# Patient Record
Sex: Female | Born: 1985 | Race: White | Hispanic: No | Marital: Married | Smoking: Never smoker
Health system: Southern US, Community
[De-identification: ages and names within clinical notes are randomized; demographics above are authoritative.]

## PROBLEM LIST (undated history)

## (undated) ENCOUNTER — Inpatient Hospital Stay (HOSPITAL_COMMUNITY): Payer: Self-pay

## (undated) DIAGNOSIS — J45909 Unspecified asthma, uncomplicated: Secondary | ICD-10-CM

## (undated) DIAGNOSIS — D649 Anemia, unspecified: Secondary | ICD-10-CM

## (undated) DIAGNOSIS — N814 Uterovaginal prolapse, unspecified: Secondary | ICD-10-CM

## (undated) DIAGNOSIS — K219 Gastro-esophageal reflux disease without esophagitis: Secondary | ICD-10-CM

## (undated) DIAGNOSIS — I319 Disease of pericardium, unspecified: Secondary | ICD-10-CM

## (undated) DIAGNOSIS — O2686 Pruritic urticarial papules and plaques of pregnancy (PUPPP): Secondary | ICD-10-CM

## (undated) HISTORY — PX: WISDOM TOOTH EXTRACTION: SHX21

## (undated) HISTORY — DX: Uterovaginal prolapse, unspecified: N81.4

## (undated) HISTORY — PX: OTHER SURGICAL HISTORY: SHX169

---
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
## 2003-08-01 ENCOUNTER — Other Ambulatory Visit: Admission: RE | Admit: 2003-08-01 | Discharge: 2003-08-01 | Payer: Self-pay | Admitting: Family Medicine

## 2004-07-26 ENCOUNTER — Other Ambulatory Visit: Admission: RE | Admit: 2004-07-26 | Discharge: 2004-07-26 | Payer: Self-pay | Admitting: Family Medicine

## 2010-01-14 ENCOUNTER — Inpatient Hospital Stay (HOSPITAL_COMMUNITY): Admission: RE | Admit: 2010-01-14 | Discharge: 2010-01-16 | Payer: Self-pay | Admitting: Obstetrics & Gynecology

## 2010-11-08 LAB — CBC
HCT: 32.9 % — ABNORMAL LOW (ref 36.0–46.0)
HCT: 33.3 % — ABNORMAL LOW (ref 36.0–46.0)
Hemoglobin: 11.5 g/dL — ABNORMAL LOW (ref 12.0–15.0)
Hemoglobin: 11.9 g/dL — ABNORMAL LOW (ref 12.0–15.0)
MCHC: 34.9 g/dL (ref 30.0–36.0)
MCHC: 35.6 g/dL (ref 30.0–36.0)
Platelets: 177 10*3/uL (ref 150–400)
Platelets: 189 10*3/uL (ref 150–400)
RDW: 13.1 % (ref 11.5–15.5)
RDW: 13.3 % (ref 11.5–15.5)

## 2010-11-08 LAB — RPR TITER: RPR Titer: 1:2 {titer} — AB

## 2011-01-07 NOTE — Op Note (Signed)
[place]. [facility]  Patient:    Savannah Arnold, CHAMBLIN Visit Number: [id number] MRN: [id number]          Service Type: DSU Location: [facility] Attending Physician:  Consuello Bossier Dictated by:   Consuello Bossier., M.D. Proc. Date: 10/02/01 Admit Date:  10/02/2001                             Operative Report  PREOPERATIVE DIAGNOSIS:  Two pigmented lesions, one measuring 1 cm, and one measuring 1.5 cm.  Adjacent to one another in the left anterior scalp area.  POSTOPERATIVE DIAGNOSIS:  Two pigmented lesions, one measuring 1 cm, and one measuring 1.5 cm.  Adjacent to one another in the left anterior scalp area.  OPERATION:  Wide local excision of the two in continuity, producing a 3.5 cm in length complex wound closure.  SURGEON:  Consuello Bossier., M.D.  ANESTHESIA:  Xylocaine 2% with epinephrine 1:100,000.  FINDINGS:  The patient had two pigmented lesions, one larger than the other, in the left anterior scalp area for which the above surgical procedure was carried out.  DESCRIPTION OF PROCEDURE:  The patient was brought to the operating room, and marked off with a planned elliptical excisional biopsy of the two areas which lent themselves to be removed as one transverse specimen.  She was prepped with Betadine and draped sterilely.  She was anesthetized with Xylocaine 2% with epinephrine 1:100,000.  Following the onset of anesthesia, the elliptical excision of the two lesions in the one specimen was able to be performed. This left a resulting 3.5 cm in length open wound of the scalp, which was able to be closed with interrupted running #5-0 Prolene.  Neosporin ointment and light compression dressing was applied.  The patient tolerated the procedure well, and will return in approximately one week for suture removal, or before with any problems. Dictated by:   Consuello Bossier., M.D. Attending Physician:  Consuello Bossier DD:   10/02/01 TD:  10/02/01 Job: [id number]

## 2013-03-15 LAB — OB RESULTS CONSOLE ABO/RH: RH Type: POSITIVE

## 2013-03-15 LAB — OB RESULTS CONSOLE ANTIBODY SCREEN: ANTIBODY SCREEN: NEGATIVE

## 2013-03-15 LAB — OB RESULTS CONSOLE HIV ANTIBODY (ROUTINE TESTING): HIV: NONREACTIVE

## 2013-03-15 LAB — OB RESULTS CONSOLE RUBELLA ANTIBODY, IGM: Rubella: IMMUNE

## 2013-03-15 LAB — OB RESULTS CONSOLE HEPATITIS B SURFACE ANTIGEN: Hepatitis B Surface Ag: NEGATIVE

## 2013-08-18 ENCOUNTER — Inpatient Hospital Stay (HOSPITAL_COMMUNITY): Payer: BC Managed Care – PPO

## 2013-08-18 ENCOUNTER — Encounter (HOSPITAL_COMMUNITY): Payer: BC Managed Care – PPO

## 2013-08-18 ENCOUNTER — Encounter (HOSPITAL_COMMUNITY): Payer: Self-pay

## 2013-08-18 ENCOUNTER — Inpatient Hospital Stay (HOSPITAL_COMMUNITY)
Admission: AD | Admit: 2013-08-18 | Discharge: 2013-08-18 | Disposition: A | Discharge status: Home / Self Care | Payer: BC Managed Care – PPO | Source: Ambulatory Visit | Attending: Obstetrics & Gynecology | Admitting: Obstetrics & Gynecology

## 2013-08-18 DIAGNOSIS — M545 Low back pain, unspecified: Secondary | ICD-10-CM | POA: Insufficient documentation

## 2013-08-18 DIAGNOSIS — N859 Noninflammatory disorder of uterus, unspecified: Secondary | ICD-10-CM

## 2013-08-18 DIAGNOSIS — O30009 Twin pregnancy, unspecified number of placenta and unspecified number of amniotic sacs, unspecified trimester: Secondary | ICD-10-CM | POA: Insufficient documentation

## 2013-08-18 DIAGNOSIS — O4700 False labor before 37 completed weeks of gestation, unspecified trimester: Secondary | ICD-10-CM | POA: Insufficient documentation

## 2013-08-18 DIAGNOSIS — R109 Unspecified abdominal pain: Secondary | ICD-10-CM | POA: Insufficient documentation

## 2013-08-18 HISTORY — DX: Unspecified asthma, uncomplicated: J45.909

## 2013-08-18 LAB — WET PREP, GENITAL
Clue Cells Wet Prep HPF POC: NONE SEEN
Trich, Wet Prep: NONE SEEN
Yeast Wet Prep HPF POC: NONE SEEN

## 2013-08-18 LAB — URINALYSIS, ROUTINE W REFLEX MICROSCOPIC
Bilirubin Urine: NEGATIVE
Ketones, ur: NEGATIVE mg/dL
Nitrite: NEGATIVE
Protein, ur: NEGATIVE mg/dL
Urobilinogen, UA: 0.2 mg/dL (ref 0.0–1.0)

## 2013-08-18 NOTE — MAU Note (Signed)
Pt presents complaining of lower abdominal pain and states "she doesn't feel right and something has changed." States her discharge has changed to more of a watery consistency. Denies vaginal bleeding or discharge.

## 2013-08-18 NOTE — MAU Provider Note (Signed)
History     CSN: [id number]  Arrival date and time: 08/18/13 1520 Provider on unit: 1520 Provider notified: 1608 Provider at bedside: 1618     Chief Complaint  Patient presents with  . Abdominal Pain   HPI  Ms. Savannah Arnold is a 27 yo G2P1001 at 32.1 wks twin gestation by ultrasound.  She presents today with complaints  of abdominal cramping, low back pain and watery mucous discharge.  She reports (+) FM x 2.  She denies VB.   Past Medical History  Diagnosis Date  . Asthma     History reviewed. No pertinent past surgical history.  History reviewed. No pertinent family history.  History  Substance Use Topics  . Smoking status: Never Smoker   . Smokeless tobacco: Not on file  . Alcohol Use: No    Allergies:  Allergies  Allergen Reactions  . Sulfa Antibiotics Itching and Rash  . Tape Rash    Prescriptions prior to admission  Medication Sig Dispense Refill  . acetaminophen (TYLENOL) 500 MG tablet Take 1,000 mg by mouth every 6 (six) hours as needed for mild pain.      Marland Kitchen albuterol (PROVENTIL HFA;VENTOLIN HFA) 108 (90 BASE) MCG/ACT inhaler Inhale 2 puffs into the lungs every 6 (six) hours as needed (asthma).      Marland Kitchen amoxicillin (AMOXIL) 500 MG capsule Take 500 mg by mouth 3 (three) times daily.      . calcium carbonate (TUMS - DOSED IN MG ELEMENTAL CALCIUM) 500 MG chewable tablet Chew 1 tablet by mouth daily as needed for indigestion or heartburn.      Burnis Medin w/o A-FE-Methfol-FA-DHA (PRENA1 PLUS/QUATREFOLIC) 30-0.6-0.4 &300 MG MISC Take 30 mg by mouth at bedtime.         Review of Systems  Constitutional: Negative.   HENT: Negative.   Eyes: Negative.   Respiratory: Negative.   Cardiovascular: Negative.   Gastrointestinal: Positive for abdominal pain.       Lower abd cramping  Genitourinary: Negative.   Musculoskeletal: Negative.   Skin: Negative.   Neurological: Negative.   Endo/Heme/Allergies: Negative.   Psychiatric/Behavioral: Negative.    Results for  orders placed during the hospital encounter of 08/18/13 (from the past 24 hour(s))  URINALYSIS, ROUTINE W REFLEX MICROSCOPIC     Status: Abnormal   Collection Time    08/18/13  3:31 PM      Result Value Range   Color, Urine YELLOW  YELLOW   APPearance CLEAR  CLEAR   Specific Gravity, Urine <1.005 (*) 1.005 - 1.030   pH 6.0  5.0 - 8.0   Glucose, UA NEGATIVE  NEGATIVE mg/dL   Hgb urine dipstick TRACE (*) NEGATIVE   Bilirubin Urine NEGATIVE  NEGATIVE   Ketones, ur NEGATIVE  NEGATIVE mg/dL   Protein, ur NEGATIVE  NEGATIVE mg/dL   Urobilinogen, UA 0.2  0.0 - 1.0 mg/dL   Nitrite NEGATIVE  NEGATIVE   Leukocytes, UA NEGATIVE  NEGATIVE  URINE MICROSCOPIC-ADD ON     Status: Abnormal   Collection Time    08/18/13  3:31 PM      Result Value Range   Squamous Epithelial / LPF FEW (*) RARE   WBC, UA    <3 WBC/hpf   Value: NO FORMED ELEMENTS SEEN ON URINE MICROSCOPIC EXAMINATION   RBC / HPF 0-2  <3 RBC/hpf  AMNISURE RUPTURE OF MEMBRANE (ROM)     Status: None   Collection Time    08/18/13  4:20 PM  Result Value Range   Amnisure ROM NEGATIVE    WET PREP, GENITAL     Status: Abnormal   Collection Time    08/18/13  4:20 PM      Result Value Range   Yeast Wet Prep HPF POC NONE SEEN  NONE SEEN   Trich, Wet Prep NONE SEEN  NONE SEEN   Clue Cells Wet Prep HPF POC NONE SEEN  NONE SEEN   WBC, Wet Prep HPF POC FEW (*) NONE SEEN   Physical Exam   Blood pressure 124/59, pulse 80, temperature 98 F (36.7 C), temperature source Oral, resp. rate 18.  Physical Exam  Constitutional: She is oriented to person, place, and time. She appears well-developed and well-nourished.  HENT:  Head: Normocephalic and atraumatic.  Eyes: Pupils are equal, round, and reactive to light.  Neck: Normal range of motion. Neck supple.  Cardiovascular: Normal rate, regular rhythm and normal heart sounds.   Respiratory: Effort normal and breath sounds normal.  GI: Soft. Bowel sounds are normal.  Genitourinary: Uterus  normal. Vaginal discharge found.  Gravid; thcik white vaginal d/c  Musculoskeletal: Normal range of motion.  Neurological: She is alert and oriented to person, place, and time.  Skin: Skin is warm and dry.  Psychiatric: She has a normal mood and affect. Her behavior is normal. Judgment and thought content normal.  VE: closed/long/soft  MAU Course  Procedures CCUA Wet Prep Amnisure EFM Urine culture Assessment and Plan  27 yo G2P1001 at 32.1 wks twin gestation Uterine Irritability Category 1 FHR tracing x 2  Discharge home Preterm Labor Precautions Keep scheduled appointment with Dr. Seymour Bars on 08/26/13  *Dr. Cherly Hensen notified of plan - agrees  Kenard Gower, MSN, CNM 08/18/2013, 4:08 PM

## 2013-08-19 NOTE — MAU Provider Note (Signed)
Reviewed and agree with note and plan. V.Eveny Anastas, MD  

## 2013-08-20 LAB — CULTURE, OB URINE: Special Requests: NORMAL

## 2013-09-14 ENCOUNTER — Inpatient Hospital Stay (HOSPITAL_COMMUNITY)
Admission: AD | Admit: 2013-09-14 | Discharge: 2013-09-14 | Disposition: A | Discharge status: Home / Self Care | Payer: BC Managed Care – PPO | Source: Ambulatory Visit | Attending: Obstetrics and Gynecology | Admitting: Obstetrics and Gynecology

## 2013-09-14 DIAGNOSIS — L299 Pruritus, unspecified: Secondary | ICD-10-CM | POA: Insufficient documentation

## 2013-09-14 DIAGNOSIS — O30009 Twin pregnancy, unspecified number of placenta and unspecified number of amniotic sacs, unspecified trimester: Secondary | ICD-10-CM | POA: Insufficient documentation

## 2013-09-14 DIAGNOSIS — IMO0001 Reserved for inherently not codable concepts without codable children: Secondary | ICD-10-CM | POA: Diagnosis present

## 2013-09-14 DIAGNOSIS — O9989 Other specified diseases and conditions complicating pregnancy, childbirth and the puerperium: Secondary | ICD-10-CM | POA: Insufficient documentation

## 2013-09-14 NOTE — MAU Provider Note (Signed)
History    CSN: [id number]  Arrival date and time: 09/14/13 [id number] Provider here to see patient @ 1005   Chief Complaint  Patient presents with  . Non-stress Test   HPI  Twin gestation  - questionable fetal activity (difficulty to determine movements in both babies) Itching x 2 weeks  - lab tests pending for ICP initiated Actigall this AM - presumptive treatment pending test results  Past Medical History  Diagnosis Date  . Asthma    No past surgical history on file.  No family history on file.  History  Substance Use Topics  . Smoking status: Never Smoker   . Smokeless tobacco: Not on file  . Alcohol Use: No   Allergies:  Allergies  Allergen Reactions  . Sulfa Antibiotics Itching and Rash  . Tape Rash   Prescriptions prior to admission  Medication Sig Dispense Refill  . acetaminophen (TYLENOL) 500 MG tablet Take 1,000 mg by mouth every 6 (six) hours as needed for mild pain.      Marland Kitchen. albuterol (PROVENTIL HFA;VENTOLIN HFA) 108 (90 BASE) MCG/ACT inhaler Inhale 2 puffs into the lungs every 6 (six) hours as needed (asthma).      Marland Kitchen. amoxicillin (AMOXIL) 500 MG capsule Take 500 mg by mouth 3 (three) times daily.      . calcium carbonate (TUMS - DOSED IN MG ELEMENTAL CALCIUM) 500 MG chewable tablet Chew 1 tablet by mouth daily as needed for indigestion or heartburn.      Burnis Medin. Prenat w/o A-FE-Methfol-FA-DHA (PRENA1 PLUS/QUATREFOLIC) 30-0.6-0.4 &300 MG MISC Take 30 mg by mouth at bedtime.        ROS Itching generalized x 2 weeks  No rash / not associated with new exposure or dry skin FM present - does not always feel babies moving - not sure which baby is moving No nausea or epigastric pain  Physical Exam   Temperature 98 F (36.7 C), temperature source Oral, resp. rate 16.  Physical Exam Alert and oriented Abdomen soft and non-tender - visible fetal activity with small limb movements Defer VE   MAU Course  Procedures : NST - twins  A- Right lower quadrant - 130 /  moderate variability / + accels to 160 / no decels B-Left upper quadrant - 140 / moderate variability / + accels to 168 / no decels  Toco - UI with some mild ctx  Assessment and Plan  36 weeks Twin gestation Generalized pruritis x 2 weeks - labs pending for cholestasis of pregnancy  1) NST today - reactive x 2  2) reviewed FKC with two babies - placing hands over each baby to feel gross body movements                                                     - watching abdomen for movements                                                        - all movements count - stretching / kicks / hiccups  3) ROB visit Tuesday as scheduled - office will contact if labs back prior to appointment  Marlinda MikeBAILEY, TANYA 09/14/2013, 10:16 AM

## 2013-09-14 NOTE — Discharge Instructions (Signed)
Pruritus  Pruritis is an itch. There are many different problems that can cause an itch.  HOME CARE INSTRUCTIONS   Make sure your skin is moistened on a regular basis.   Take RX for Atarax  by mouth as the instructions direct to reduce itching - may cause drowsiness.  Take Actigall as prescribed until labs results are called to you with further instructions.  Avoid scratching.   Avoid hot showers, which can make itching worse. A cool shower may help with itching as long as you use a moisturizer after the shower. SEEK MEDICAL CARE IF: Call your provider  Nausea and vomiting develop Pain in upper abdomen - similar to severe heartburn or indigestion Decrease fetal movements in ether baby (place hands over each baby to monitor each baby's movement - should feel 4 from each baby over 2 hours)   Multiple Pregnancy  A multiple pregnancy is when a woman is pregnant with two or more fetuses. Multiple pregnancies occur in about 3% of all births. The more babies in a pregnancy, the greater the risks of problems to the babies and mother. This includes death. Since the use of Assisted Reproductive Technology (ART) and medications that can induce ovulation, multiple fetal gestation has increased.    SEEK IMMEDIATE MEDICAL CARE IF:   You develop a temperature of 102 F (38.9 C) or higher.  You are leaking fluid from the vagina.  You develop vaginal bleeding.  You develop uterine contractions.  You develop a severe headache, severe upper abdominal pain, visual problems or excessive swelling of your face, hands and feet.  You develop severe back pain or leg pain.  You develop severe tiredness.  You develop chest pain.  You have shortness of breath, fall down or pass out.   Document Released: 05/17/2008 Document Revised: 10/31/2011 Document Reviewed: 05/17/2008 [facility] Patient Information 2014 [place], [place].   Document Released: 04/20/2011 Document Revised: 10/31/2011 Document  Reviewed: 04/20/2011 [facility] Patient Information 2014 [place], [place].

## 2013-09-17 ENCOUNTER — Other Ambulatory Visit: Payer: Self-pay | Admitting: Obstetrics & Gynecology

## 2013-09-19 ENCOUNTER — Encounter (HOSPITAL_COMMUNITY): Payer: Self-pay | Admitting: *Deleted

## 2013-09-20 ENCOUNTER — Encounter (HOSPITAL_COMMUNITY): Payer: Self-pay

## 2013-09-20 ENCOUNTER — Encounter (HOSPITAL_COMMUNITY)
Admission: RE | Admit: 2013-09-20 | Discharge: 2013-09-20 | Disposition: A | Discharge status: Home / Self Care | Payer: BC Managed Care – PPO | Source: Ambulatory Visit | Attending: Obstetrics & Gynecology | Admitting: Obstetrics & Gynecology

## 2013-09-20 VITALS — BP 115/74 | HR 81 | Temp 98.4°F | Resp 20 | Ht 63.0 in | Wt 175.0 lb

## 2013-09-20 DIAGNOSIS — IMO0001 Reserved for inherently not codable concepts without codable children: Secondary | ICD-10-CM

## 2013-09-20 HISTORY — DX: Disease of pericardium, unspecified: I31.9

## 2013-09-20 HISTORY — DX: Gastro-esophageal reflux disease without esophagitis: K21.9

## 2013-09-20 HISTORY — DX: Pruritic urticarial papules and plaques of pregnancy (puppp): O26.86

## 2013-09-20 HISTORY — DX: Anemia, unspecified: D64.9

## 2013-09-20 LAB — CBC
HEMATOCRIT: 32.1 % — AB (ref 36.0–46.0)
Hemoglobin: 11.5 g/dL — ABNORMAL LOW (ref 12.0–15.0)
MCH: 33 pg (ref 26.0–34.0)
MCHC: 35.8 g/dL (ref 30.0–36.0)
MCV: 92.2 fL (ref 78.0–100.0)
Platelets: 154 10*3/uL (ref 150–400)
RBC: 3.48 MIL/uL — AB (ref 3.87–5.11)
RDW: 14.3 % (ref 11.5–15.5)
WBC: 8.8 10*3/uL (ref 4.0–10.5)

## 2013-09-20 LAB — TYPE AND SCREEN
ABO/RH(D): O POS
Antibody Screen: NEGATIVE

## 2013-09-20 LAB — RPR: RPR Ser Ql: REACTIVE — AB

## 2013-09-20 LAB — ABO/RH: ABO/RH(D): O POS

## 2013-09-20 LAB — RPR TITER

## 2013-09-20 NOTE — Patient Instructions (Addendum)
Your procedure is scheduled on: Monday, September 23, 2013  [facility] 12 noon  Pick up the phone at the desk and dial [phone].  Call this number if you have problems the morning of surgery: [phone].  Remember: Do NOT eat food: AFTER MIDNIGHT SUNDAY Do NOT drink clear liquids after: AFTER 9:30AM DAY OF SURGERY Take these medicines the morning of surgery with a SIP OF WATER: NONE *BRING ASTHMA INHALER DAY OD SURGERY  Do NOT wear jewelry (body piercing), make-up, or nail polish. Do NOT wear lotions, powders, or perfumes.  You may wear deoderant. Do NOT shave for 48 hours prior to surgery. Do NOT bring valuables to the hospital. Contacts, dentures, or bridgework may not be worn into surgery. Leave suitcase in car.  After surgery it may be brought to your room.  For patients admitted to the hospital, checkout time is 11:00 AM the day of discharge.    Incentive Spirometer An incentive spirometer is a tool that can help keep your lungs clear and active. This tool measures how well you are filling your lungs with each breath. Taking long deep breaths may help reverse or decrease the chance of developing breathing (pulmonary) problems (especially infection) following:   Surgery of the chest or abdomen.  Surgery if you have a history of smoking or a lung problem.  A long period of time when you are unable to move or be active.  BEFORE THE PROCEDURE   If the spirometer includes an indictor to show your best effort, your nurse or respiratory therapist will set it to a desired goal.  If possible, sit up straight or lean slightly forward. Try not to slouch.  Hold the incentive spirometer in an upright position.  INSTRUCTIONS FOR USE  1. Sit on the edge of your bed if possible, or sit up as far as you can in bed or on a chair. 2. Hold the incentive spirometer in an upright position. 3. Breathe out normally. 4. Place the mouthpiece in your  mouth and seal your lips tightly around it. 5. Breathe in slowly and as deeply as possible, raising the piston or the ball toward the top of the column. 6. Hold your breath for 3-5 seconds or for as long as possible. Allow the piston or ball to fall to the bottom of the column. 7. Remove the mouthpiece from your mouth and breathe out normally. 8. Rest for a few seconds and repeat Steps 1 through 7 at least 10 times every 1-2 hours when you are awake. Take your time and take a few normal breaths between deep breaths. 9. The spirometer may include an indicator to show your best effort. Use the indicator as a goal to work toward during each repetition. 10. After each set of 10 deep breaths, practice coughing to be sure your lungs are clear. If you have an incision (the cut made at the time of surgery), support your incision when coughing by placing a pillow or rolled up towels firmly against it. Once you are able to get out of bed, walk around indoors and cough well. You may stop using the incentive spirometer when instructed by your caregiver.   RISKS AND COMPLICATIONS  Breathing too quickly may cause dizziness. At an extreme, this could cause you to pass out. Take your time so you do not get dizzy or light-headed.  If you are in pain, you may need to take or ask for pain medication before  doing incentive spirometry. It is harder to take a deep breath if you are having pain.  AFTER USE  Rest and breathe slowly and easily.  It can be helpful to keep track of a log of your progress. Your caregiver can provide you with a simple table to help with this.  If you are using the spirometer at home, follow these instructions:  SEEK MEDICAL CARE IF:   You are having difficultly using the spirometer.  You have trouble using the spirometer as often as instructed.  Your pain medication is not giving enough relief while using the spirometer.  You develop fever of 100.5 F (38.1 C) or higher.  SEEK  IMMEDIATE MEDICAL CARE IF:   You cough up bloody sputum that had not been present before.  You develop fever of 102 F (38.9 C) or greater.  You develop worsening pain at or near the incision site.  MAKE SURE YOU:   Understand these instructions.  Will watch your condition.  Will get help right away if you are not doing well or get worse.  Document Released: 12/19/2006 Document Revised: 10/31/2011 Document Reviewed: 02/19/2007 [facility] Patient Information 2014 [place], [place].

## 2013-09-20 NOTE — Patient Instructions (Addendum)
Your procedure is scheduled on: Saturday, September 21, 2013  [facility] [employer] [facility] at: 06:15AM  Pick up the phone at the desk and dial [phone].  Call this number if you have problems the morning of surgery: [phone].  Remember: Do NOT eat food: AFTER MIDNIGHT TONIGHT Do NOT drink clear liquids after:  AFTER MIDNIGHT TONIGHT Take these medicines the morning of surgery with a SIP OF WATER: NONE  Do NOT wear jewelry (body piercing), make-up, or nail polish. Do NOT wear lotions, powders, or perfumes.  You may wear deoderant. Do NOT shave for 48 hours prior to surgery. Do NOT bring valuables to the hospital. Contacts, dentures, or bridgework may not be worn into surgery. Leave suitcase in car.  After surgery it may be brought to your room.  For patients admitted to the hospital, checkout time is 11:00 AM the day of discharge.   Incentive Spirometer An incentive spirometer is a tool that can help keep your lungs clear and active. This tool measures how well you are filling your lungs with each breath. Taking long deep breaths may help reverse or decrease the chance of developing breathing (pulmonary) problems (especially infection) following:   Surgery of the chest or abdomen.  Surgery if you have a history of smoking or a lung problem.  A long period of time when you are unable to move or be active.  BEFORE THE PROCEDURE   If the spirometer includes an indictor to show your best effort, your nurse or respiratory therapist will set it to a desired goal.  If possible, sit up straight or lean slightly forward. Try not to slouch.  Hold the incentive spirometer in an upright position.  INSTRUCTIONS FOR USE  1. Sit on the edge of your bed if possible, or sit up as far as you can in bed or on a chair. 2. Hold the incentive spirometer in an upright position. 3. Breathe out normally. 4. Place the mouthpiece in your mouth and seal your lips tightly around  it. 5. Breathe in slowly and as deeply as possible, raising the piston or the ball toward the top of the column. 6. Hold your breath for 3-5 seconds or for as long as possible. Allow the piston or ball to fall to the bottom of the column. 7. Remove the mouthpiece from your mouth and breathe out normally. 8. Rest for a few seconds and repeat Steps 1 through 7 at least 10 times every 1-2 hours when you are awake. Take your time and take a few normal breaths between deep breaths. 9. The spirometer may include an indicator to show your best effort. Use the indicator as a goal to work toward during each repetition. 10. After each set of 10 deep breaths, practice coughing to be sure your lungs are clear. If you have an incision (the cut made at the time of surgery), support your incision when coughing by placing a pillow or rolled up towels firmly against it. Once you are able to get out of bed, walk around indoors and cough well. You may stop using the incentive spirometer when instructed by your caregiver.   RISKS AND COMPLICATIONS  Breathing too quickly may cause dizziness. At an extreme, this could cause you to pass out. Take your time so you do not get dizzy or light-headed.  If you are in pain, you may need to take or ask for pain medication before doing incentive spirometry. It is harder to take a  deep breath if you are having pain.  AFTER USE  Rest and breathe slowly and easily.  It can be helpful to keep track of a log of your progress. Your caregiver can provide you with a simple table to help with this.  If you are using the spirometer at home, follow these instructions:  SEEK MEDICAL CARE IF:   You are having difficultly using the spirometer.  You have trouble using the spirometer as often as instructed.  Your pain medication is not giving enough relief while using the spirometer.  You develop fever of 100.5 F (38.1 C) or higher.  SEEK IMMEDIATE MEDICAL CARE IF:   You cough  up bloody sputum that had not been present before.  You develop fever of 102 F (38.9 C) or greater.  You develop worsening pain at or near the incision site.  MAKE SURE YOU:   Understand these instructions.  Will watch your condition.  Will get help right away if you are not doing well or get worse.  Document Released: 12/19/2006 Document Revised: 10/31/2011 Document Reviewed: 02/19/2007 [facility] Patient Information 2014 [place]ExitCare, [place]LL[place].

## 2013-09-21 ENCOUNTER — Encounter (HOSPITAL_COMMUNITY): Payer: Self-pay | Admitting: *Deleted

## 2013-09-21 ENCOUNTER — Inpatient Hospital Stay (HOSPITAL_COMMUNITY): Payer: BC Managed Care – PPO | Admitting: Anesthesiology

## 2013-09-21 ENCOUNTER — Encounter (HOSPITAL_COMMUNITY): Payer: BC Managed Care – PPO | Admitting: Anesthesiology

## 2013-09-21 ENCOUNTER — Inpatient Hospital Stay (HOSPITAL_COMMUNITY)
Admission: AD | Admit: 2013-09-21 | Discharge: 2013-09-21 | Disposition: A | Discharge status: Home / Self Care | Payer: BC Managed Care – PPO | Source: Ambulatory Visit | Attending: Obstetrics & Gynecology | Admitting: Obstetrics & Gynecology

## 2013-09-21 ENCOUNTER — Inpatient Hospital Stay (HOSPITAL_COMMUNITY)
Admission: RE | Admit: 2013-09-21 | Discharge: 2013-09-24 | DRG: 765 | Disposition: A | Discharge status: Home / Self Care | Payer: BC Managed Care – PPO | Source: Ambulatory Visit | Attending: Obstetrics & Gynecology | Admitting: Obstetrics & Gynecology

## 2013-09-21 ENCOUNTER — Encounter (HOSPITAL_COMMUNITY)
Admission: RE | Disposition: A | Discharge status: Home / Self Care | Payer: Self-pay | Source: Ambulatory Visit | Attending: Obstetrics & Gynecology

## 2013-09-21 DIAGNOSIS — O30049 Twin pregnancy, dichorionic/diamniotic, unspecified trimester: Secondary | ICD-10-CM

## 2013-09-21 DIAGNOSIS — O329XX0 Maternal care for malpresentation of fetus, unspecified, not applicable or unspecified: Principal | ICD-10-CM

## 2013-09-21 DIAGNOSIS — O30009 Twin pregnancy, unspecified number of placenta and unspecified number of amniotic sacs, unspecified trimester: Secondary | ICD-10-CM | POA: Diagnosis present

## 2013-09-21 DIAGNOSIS — D62 Acute posthemorrhagic anemia: Secondary | ICD-10-CM | POA: Diagnosis not present

## 2013-09-21 DIAGNOSIS — IMO0001 Reserved for inherently not codable concepts without codable children: Secondary | ICD-10-CM

## 2013-09-21 DIAGNOSIS — J45909 Unspecified asthma, uncomplicated: Secondary | ICD-10-CM | POA: Diagnosis present

## 2013-09-21 DIAGNOSIS — Z9889 Other specified postprocedural states: Secondary | ICD-10-CM

## 2013-09-21 DIAGNOSIS — O99892 Other specified diseases and conditions complicating childbirth: Secondary | ICD-10-CM | POA: Diagnosis present

## 2013-09-21 DIAGNOSIS — O9989 Other specified diseases and conditions complicating pregnancy, childbirth and the puerperium: Secondary | ICD-10-CM

## 2013-09-21 DIAGNOSIS — O3090 Multiple gestation, unspecified, unspecified trimester: Principal | ICD-10-CM | POA: Diagnosis present

## 2013-09-21 DIAGNOSIS — O26899 Other specified pregnancy related conditions, unspecified trimester: Secondary | ICD-10-CM | POA: Diagnosis present

## 2013-09-21 DIAGNOSIS — O9903 Anemia complicating the puerperium: Secondary | ICD-10-CM | POA: Diagnosis not present

## 2013-09-21 DIAGNOSIS — L299 Pruritus, unspecified: Secondary | ICD-10-CM | POA: Diagnosis present

## 2013-09-21 SURGERY — Surgical Case
Anesthesia: Spinal | Site: Abdomen

## 2013-09-21 MED ORDER — FENTANYL CITRATE 0.05 MG/ML IJ SOLN
INTRAMUSCULAR | Status: AC
  Filled 2013-09-21: qty 2

## 2013-09-21 MED ORDER — BUPIVACAINE HCL (PF) 0.25 % IJ SOLN
INTRAMUSCULAR | Status: AC
  Filled 2013-09-21: qty 30

## 2013-09-21 MED ORDER — PROMETHAZINE HCL 25 MG/ML IJ SOLN: 6.2500 mg | INTRAMUSCULAR | Status: DC | PRN

## 2013-09-21 MED ORDER — OXYTOCIN 40 UNITS IN LACTATED RINGERS INFUSION - SIMPLE MED: 62.5000 mL/h | INTRAVENOUS | Status: AC

## 2013-09-21 MED ORDER — DIPHENHYDRAMINE HCL 25 MG PO CAPS
25.0000 mg | ORAL_CAPSULE | Freq: Four times a day (QID) | ORAL | Status: DC
  Administered 2013-09-21 – 2013-09-24 (×10): 25 mg via ORAL
  Filled 2013-09-21 (×17): qty 1

## 2013-09-21 MED ORDER — NALOXONE HCL 0.4 MG/ML IJ SOLN: 0.4000 mg | INTRAMUSCULAR | Status: DC | PRN

## 2013-09-21 MED ORDER — LANOLIN HYDROUS EX OINT: 1.0000 "application " | TOPICAL_OINTMENT | CUTANEOUS | Status: DC | PRN

## 2013-09-21 MED ORDER — IBUPROFEN 600 MG PO TABS
600.0000 mg | ORAL_TABLET | Freq: Four times a day (QID) | ORAL | Status: DC
  Administered 2013-09-22 – 2013-09-24 (×10): 600 mg via ORAL
  Filled 2013-09-21 (×10): qty 1

## 2013-09-21 MED ORDER — SODIUM CHLORIDE 0.9 % IJ SOLN: 3.0000 mL | INTRAMUSCULAR | Status: DC | PRN

## 2013-09-21 MED ORDER — CITRIC ACID-SODIUM CITRATE 334-500 MG/5ML PO SOLN
30.0000 mL | Freq: Once | ORAL | Status: AC
  Administered 2013-09-21: 30 mL via ORAL
  Filled 2013-09-21: qty 15

## 2013-09-21 MED ORDER — FERROUS SULFATE 325 (65 FE) MG PO TABS
325.0000 mg | ORAL_TABLET | Freq: Two times a day (BID) | ORAL | Status: DC
  Administered 2013-09-21 – 2013-09-24 (×6): 325 mg via ORAL
  Filled 2013-09-21 (×6): qty 1

## 2013-09-21 MED ORDER — CEFAZOLIN SODIUM-DEXTROSE 2-3 GM-% IV SOLR
2.0000 g | Freq: Once | INTRAVENOUS | Status: AC
  Administered 2013-09-21 (×2): 2 g via INTRAVENOUS
  Filled 2013-09-21: qty 50

## 2013-09-21 MED ORDER — SCOPOLAMINE 1 MG/3DAYS TD PT72
1.0000 | MEDICATED_PATCH | Freq: Once | TRANSDERMAL | Status: AC
  Administered 2013-09-21: 1.5 mg via TRANSDERMAL

## 2013-09-21 MED ORDER — ALBUTEROL SULFATE (2.5 MG/3ML) 0.083% IN NEBU
3.0000 mL | INHALATION_SOLUTION | Freq: Four times a day (QID) | RESPIRATORY_TRACT | Status: DC | PRN
  Filled 2013-09-21: qty 3

## 2013-09-21 MED ORDER — CEFAZOLIN SODIUM-DEXTROSE 2-3 GM-% IV SOLR
2.0000 g | Freq: Three times a day (TID) | INTRAVENOUS | Status: DC
Start: 2013-09-21 — End: 2013-09-21

## 2013-09-21 MED ORDER — DIPHENHYDRAMINE HCL 25 MG PO CAPS
25.0000 mg | ORAL_CAPSULE | ORAL | Status: DC | PRN
Start: 2013-09-21 — End: 2013-09-24
  Administered 2013-09-22 – 2013-09-24 (×3): 25 mg via ORAL
  Filled 2013-09-21 (×3): qty 1

## 2013-09-21 MED ORDER — KETOROLAC TROMETHAMINE 30 MG/ML IJ SOLN: 30.0000 mg | Freq: Four times a day (QID) | INTRAMUSCULAR | Status: AC | PRN

## 2013-09-21 MED ORDER — OXYCODONE-ACETAMINOPHEN 5-325 MG PO TABS
1.0000 | ORAL_TABLET | ORAL | Status: DC | PRN
  Administered 2013-09-22 – 2013-09-23 (×11): 1 via ORAL
  Administered 2013-09-24 (×3): 2 via ORAL
  Filled 2013-09-21 (×2): qty 1
  Filled 2013-09-21: qty 2
  Filled 2013-09-21 (×7): qty 1
  Filled 2013-09-21 (×2): qty 2
  Filled 2013-09-21 (×3): qty 1

## 2013-09-21 MED ORDER — MEPERIDINE HCL 25 MG/ML IJ SOLN: 6.2500 mg | INTRAMUSCULAR | Status: DC | PRN

## 2013-09-21 MED ORDER — DIPHENHYDRAMINE HCL 50 MG/ML IJ SOLN: 25.0000 mg | INTRAMUSCULAR | Status: DC | PRN

## 2013-09-21 MED ORDER — OXYTOCIN 10 UNIT/ML IJ SOLN
40.0000 [IU] | INTRAVENOUS | Status: DC | PRN
  Administered 2013-09-21: 40 [IU] via INTRAVENOUS

## 2013-09-21 MED ORDER — HYDROCORTISONE NA SUCCINATE PF 1000 MG IJ SOLR
INTRAMUSCULAR | Status: DC | PRN
  Administered 2013-09-21: 100 mg via INTRAVENOUS

## 2013-09-21 MED ORDER — KETOROLAC TROMETHAMINE 30 MG/ML IJ SOLN
30.0000 mg | Freq: Four times a day (QID) | INTRAMUSCULAR | Status: AC | PRN
  Administered 2013-09-21: 30 mg via INTRAVENOUS
  Filled 2013-09-21: qty 1

## 2013-09-21 MED ORDER — ONDANSETRON HCL 4 MG PO TABS: 4.0000 mg | ORAL_TABLET | ORAL | Status: DC | PRN

## 2013-09-21 MED ORDER — NALOXONE HCL 1 MG/ML IJ SOLN
1.0000 ug/kg/h | INTRAVENOUS | Status: DC | PRN
  Filled 2013-09-21: qty 2

## 2013-09-21 MED ORDER — KETOROLAC TROMETHAMINE 60 MG/2ML IM SOLN
INTRAMUSCULAR | Status: AC
  Administered 2013-09-21: 60 mg via INTRAMUSCULAR
  Filled 2013-09-21: qty 2

## 2013-09-21 MED ORDER — SIMETHICONE 80 MG PO CHEW
80.0000 mg | CHEWABLE_TABLET | Freq: Three times a day (TID) | ORAL | Status: DC
  Administered 2013-09-21 – 2013-09-24 (×9): 80 mg via ORAL
  Filled 2013-09-21 (×7): qty 1

## 2013-09-21 MED ORDER — KETOROLAC TROMETHAMINE 60 MG/2ML IM SOLN
60.0000 mg | Freq: Once | INTRAMUSCULAR | Status: AC | PRN
  Administered 2013-09-21: 60 mg via INTRAMUSCULAR

## 2013-09-21 MED ORDER — PRENATAL MULTIVITAMIN CH
1.0000 | ORAL_TABLET | Freq: Every day | ORAL | Status: DC
  Administered 2013-09-22 – 2013-09-23 (×2): 1 via ORAL
  Filled 2013-09-21 (×2): qty 1

## 2013-09-21 MED ORDER — FENTANYL CITRATE 0.05 MG/ML IJ SOLN: 25.0000 ug | INTRAMUSCULAR | Status: DC | PRN

## 2013-09-21 MED ORDER — LACTATED RINGERS IV SOLN: INTRAVENOUS | Status: DC

## 2013-09-21 MED ORDER — SIMETHICONE 80 MG PO CHEW
80.0000 mg | CHEWABLE_TABLET | ORAL | Status: DC | PRN
  Filled 2013-09-21: qty 1

## 2013-09-21 MED ORDER — NALBUPHINE HCL 10 MG/ML IJ SOLN: 5.0000 mg | INTRAMUSCULAR | Status: DC | PRN

## 2013-09-21 MED ORDER — FENTANYL CITRATE 0.05 MG/ML IJ SOLN
INTRAMUSCULAR | Status: DC | PRN
  Administered 2013-09-21: 12.5 ug via INTRATHECAL

## 2013-09-21 MED ORDER — SCOPOLAMINE 1 MG/3DAYS TD PT72
MEDICATED_PATCH | TRANSDERMAL | Status: AC
  Administered 2013-09-21: 1.5 mg via TRANSDERMAL
  Filled 2013-09-21: qty 1

## 2013-09-21 MED ORDER — MAGNESIUM HYDROXIDE 400 MG/5ML PO SUSP: 30.0000 mL | ORAL | Status: DC | PRN

## 2013-09-21 MED ORDER — MORPHINE SULFATE 0.5 MG/ML IJ SOLN
INTRAMUSCULAR | Status: AC
  Filled 2013-09-21: qty 10

## 2013-09-21 MED ORDER — ZOLPIDEM TARTRATE 5 MG PO TABS: 5.0000 mg | ORAL_TABLET | Freq: Every evening | ORAL | Status: DC | PRN

## 2013-09-21 MED ORDER — SCOPOLAMINE 1 MG/3DAYS TD PT72
1.0000 | MEDICATED_PATCH | Freq: Once | TRANSDERMAL | Status: DC
  Filled 2013-09-21: qty 1

## 2013-09-21 MED ORDER — SENNOSIDES-DOCUSATE SODIUM 8.6-50 MG PO TABS
2.0000 | ORAL_TABLET | ORAL | Status: DC
  Administered 2013-09-22 – 2013-09-24 (×3): 2 via ORAL
  Filled 2013-09-21 (×3): qty 2

## 2013-09-21 MED ORDER — TETANUS-DIPHTH-ACELL PERTUSSIS 5-2.5-18.5 LF-MCG/0.5 IM SUSP: 0.5000 mL | Freq: Once | INTRAMUSCULAR | Status: DC

## 2013-09-21 MED ORDER — ONDANSETRON HCL 4 MG/2ML IJ SOLN
INTRAMUSCULAR | Status: AC
  Filled 2013-09-21: qty 2

## 2013-09-21 MED ORDER — ONDANSETRON HCL 4 MG/2ML IJ SOLN: 4.0000 mg | INTRAMUSCULAR | Status: DC | PRN

## 2013-09-21 MED ORDER — FAMOTIDINE IN NACL 20-0.9 MG/50ML-% IV SOLN
20.0000 mg | Freq: Once | INTRAVENOUS | Status: AC
Start: 2013-09-21 — End: 2013-09-21
  Administered 2013-09-21: 20 mg via INTRAVENOUS
  Filled 2013-09-21: qty 50

## 2013-09-21 MED ORDER — EPHEDRINE SULFATE 50 MG/ML IJ SOLN
INTRAMUSCULAR | Status: DC | PRN
  Administered 2013-09-21: 5 mg via INTRAVENOUS
  Administered 2013-09-21: 10 mg via INTRAVENOUS
  Administered 2013-09-21: 5 mg via INTRAVENOUS

## 2013-09-21 MED ORDER — PHENYLEPHRINE 8 MG IN D5W 100 ML (0.08MG/ML) PREMIX OPTIME
INJECTION | INTRAVENOUS | Status: DC | PRN
  Administered 2013-09-21: 60 ug/min via INTRAVENOUS

## 2013-09-21 MED ORDER — DIPHENHYDRAMINE HCL 50 MG/ML IJ SOLN: 12.5000 mg | INTRAMUSCULAR | Status: DC | PRN

## 2013-09-21 MED ORDER — DIBUCAINE 1 % RE OINT: 1.0000 "application " | TOPICAL_OINTMENT | RECTAL | Status: DC | PRN

## 2013-09-21 MED ORDER — LACTATED RINGERS IV SOLN
INTRAVENOUS | Status: DC
  Administered 2013-09-21: 21:00:00 via INTRAVENOUS

## 2013-09-21 MED ORDER — ONDANSETRON HCL 4 MG/2ML IJ SOLN
INTRAMUSCULAR | Status: DC | PRN
  Administered 2013-09-21: 4 mg via INTRAVENOUS

## 2013-09-21 MED ORDER — MORPHINE SULFATE (PF) 0.5 MG/ML IJ SOLN
INTRAMUSCULAR | Status: DC | PRN
  Administered 2013-09-21: .1 mg via INTRATHECAL

## 2013-09-21 MED ORDER — ONDANSETRON HCL 4 MG/2ML IJ SOLN: 4.0000 mg | Freq: Three times a day (TID) | INTRAMUSCULAR | Status: DC | PRN

## 2013-09-21 MED ORDER — METOCLOPRAMIDE HCL 5 MG/ML IJ SOLN: 10.0000 mg | Freq: Three times a day (TID) | INTRAMUSCULAR | Status: DC | PRN

## 2013-09-21 MED ORDER — PNEUMOCOCCAL VAC POLYVALENT 25 MCG/0.5ML IJ INJ
0.5000 mL | INJECTION | INTRAMUSCULAR | Status: DC
  Filled 2013-09-21: qty 0.5

## 2013-09-21 MED ORDER — SIMETHICONE 80 MG PO CHEW
80.0000 mg | CHEWABLE_TABLET | ORAL | Status: DC
  Administered 2013-09-22 – 2013-09-23 (×2): 80 mg via ORAL
  Filled 2013-09-21 (×3): qty 1

## 2013-09-21 MED ORDER — MENTHOL 3 MG MT LOZG
1.0000 | LOZENGE | OROMUCOSAL | Status: DC | PRN
Start: 2013-09-21 — End: 2013-09-24

## 2013-09-21 MED ORDER — BUPIVACAINE HCL (PF) 0.25 % IJ SOLN
INTRAMUSCULAR | Status: DC | PRN
  Administered 2013-09-21: 20 mL

## 2013-09-21 MED ORDER — HYDROCORTISONE NA SUCCINATE PF 100 MG IJ SOLR
100.0000 mg | Freq: Once | INTRAMUSCULAR | Status: DC
  Filled 2013-09-21: qty 2

## 2013-09-21 MED ORDER — LACTATED RINGERS IV SOLN
INTRAVENOUS | Status: DC
  Administered 2013-09-21 (×4): via INTRAVENOUS

## 2013-09-21 MED ORDER — OXYTOCIN 10 UNIT/ML IJ SOLN
INTRAMUSCULAR | Status: AC
  Filled 2013-09-21: qty 4

## 2013-09-21 MED ORDER — WITCH HAZEL-GLYCERIN EX PADS: 1.0000 "application " | MEDICATED_PAD | CUTANEOUS | Status: DC | PRN

## 2013-09-21 SURGICAL SUPPLY — 37 items
CLAMP CORD UMBIL (MISCELLANEOUS) ×2 IMPLANT
CLOTH BEACON ORANGE TIMEOUT ST (SAFETY) ×3 IMPLANT
CONTAINER PREFILL 10% NBF 15ML (MISCELLANEOUS) IMPLANT
DRAPE LG THREE QUARTER DISP (DRAPES) IMPLANT
DRSG OPSITE POSTOP 4X10 (GAUZE/BANDAGES/DRESSINGS) ×3 IMPLANT
DURAPREP 26ML APPLICATOR (WOUND CARE) ×3 IMPLANT
ELECT REM PT RETURN 9FT ADLT (ELECTROSURGICAL) ×3
ELECTRODE REM PT RTRN 9FT ADLT (ELECTROSURGICAL) ×1 IMPLANT
EXTRACTOR VACUUM M CUP 4 TUBE (SUCTIONS) IMPLANT
EXTRACTOR VACUUM M CUP 4' TUBE (SUCTIONS)
GLOVE BIO SURGEON STRL SZ 6.5 (GLOVE) ×2 IMPLANT
GLOVE BIO SURGEONS STRL SZ 6.5 (GLOVE) ×1
GLOVE BIOGEL PI IND STRL 7.0 (GLOVE) ×1 IMPLANT
GLOVE BIOGEL PI INDICATOR 7.0 (GLOVE) ×2
GOWN STRL REUS W/TWL LRG LVL3 (GOWN DISPOSABLE) ×6 IMPLANT
KIT ABG SYR 3ML LUER SLIP (SYRINGE) IMPLANT
NDL HYPO 25X5/8 SAFETYGLIDE (NEEDLE) IMPLANT
NEEDLE HYPO 22GX1.5 SAFETY (NEEDLE) ×3 IMPLANT
NEEDLE HYPO 25X5/8 SAFETYGLIDE (NEEDLE) IMPLANT
PACK C SECTION WH (CUSTOM PROCEDURE TRAY) ×3 IMPLANT
PAD ABD 7.5X8 STRL (GAUZE/BANDAGES/DRESSINGS) ×2 IMPLANT
PAD OB MATERNITY 4.3X12.25 (PERSONAL CARE ITEMS) ×3 IMPLANT
RTRCTR C-SECT PINK 25CM LRG (MISCELLANEOUS) ×3 IMPLANT
STAPLER VISISTAT 35W (STAPLE) IMPLANT
SUT PLAIN 0 NONE (SUTURE) IMPLANT
SUT VIC AB 0 CT1 27 (SUTURE) ×6
SUT VIC AB 0 CT1 27XBRD ANBCTR (SUTURE) ×2 IMPLANT
SUT VIC AB 0 CTX 36 (SUTURE) ×6
SUT VIC AB 0 CTX36XBRD ANBCTRL (SUTURE) ×2 IMPLANT
SUT VIC AB 2-0 CT1 27 (SUTURE) ×3
SUT VIC AB 2-0 CT1 TAPERPNT 27 (SUTURE) ×1 IMPLANT
SUT VIC AB 3-0 SH 27 (SUTURE)
SUT VIC AB 3-0 SH 27X BRD (SUTURE) IMPLANT
SYR CONTROL 10ML LL (SYRINGE) ×3 IMPLANT
TOWEL OR 17X24 6PK STRL BLUE (TOWEL DISPOSABLE) ×3 IMPLANT
TRAY FOLEY CATH 14FR (SET/KITS/TRAYS/PACK) ×3 IMPLANT
WATER STERILE IRR 1000ML POUR (IV SOLUTION) IMPLANT

## 2013-09-21 NOTE — Anesthesia Procedure Notes (Signed)
Spinal  Patient location during procedure: OR Staffing Anesthesiologist: Montez Hageman Performed by: anesthesiologist  Preanesthetic Checklist Completed: patient identified, site marked, surgical consent, pre-op evaluation, timeout performed, IV checked, risks and benefits discussed and monitors and equipment checked Spinal Block Patient position: sitting Prep: ChloraPrep Patient monitoring: heart rate, continuous pulse ox and blood pressure Approach: right paramedian Location: L3-4 Injection technique: single-shot Needle Needle type: Sprotte  Needle gauge: 24 G Needle length: 9 cm Assessment Sensory level: T4 Additional Notes Expiration date of kit checked and confirmed. Patient tolerated procedure well, without complications.

## 2013-09-21 NOTE — Transfer of Care (Signed)
Immediate Anesthesia Transfer of Care Note  Patient: Savannah Arnold  Procedure(s) Performed: Procedure(s) with comments: Primary CESAREAN SECTION  Twins (N/A) - EDD: 10/12/13  Patient Location: PACU  Anesthesia Type:Spinal  Level of Consciousness: awake and alert   Airway & Oxygen Therapy: Patient Spontanous Breathing  Post-op Assessment: Report given to PACU RN and Post -op Vital signs reviewed and stable  Post vital signs: Reviewed and stable  Complications: No apparent anesthesia complications

## 2013-09-21 NOTE — Op Note (Signed)
Preoperative diagnosis: Intrauterine pregnancy at 37 weeks and 0 day                                            Twin gestation, fetus B in breech presentation                                            Severe PUPPS  Post operative diagnosis: Same  Anesthesia: Spinal  Anesthesiologist: Dr. Latanya Presserarnignan  Procedure: Primary elective low transverse cesarean section  Surgeon: Dr. Genia DelMarie-Lyne Everley Evora  Assistant: Denton Meekolita  Dawson  Estimated blood loss: 800 cc  Procedure:  After being informed of the planned procedure and possible complications including bleeding, infection, injury to other organs, informed consent is obtained. The patient is taken to OR #9 and given spinal anesthesia without complication. She is placed in the dorsal decubitus position with the pelvis tilted to the left. She is then prepped and draped in a sterile fashion. A Foley catheter is inserted in her bladder.  After assessing adequate level of anesthesia, we infiltrate the suprapubic area with 20 cc of Marcaine 0.25 and perform a Pfannenstiel incision which is brought down sharply to the fascia. The fascia is entered in a low transverse fashion. Linea alba is dissected. Peritoneum is entered in a midline fashion. An Alexis retractor is easily positioned. Visceral peritoneum is entered in a low transverse fashion allowing us to safely retract bladder by developing a bladder flap.  The myometrium is then entered in a low transverse fashion; first with knife and then extended bluntly. Amniotic fluid is clear. We assist the birth of baby A, a female  infant in cephalic presentation. Mouth and nose are suctioned. The baby is delivered. The cord is clamped and sectioned. The baby is given to the neonatologist present in the room.  We assist the birth of baby B, a female infant in footling breech presentation.  Mouth and nose are suctioned.  The baby is delivered.  The cord is clamped and sectioned.  The baby is given to the neonatologist  present in the room.  10 cc of blood is drawn from the umbilical vein of both cords.The placenta is allowed to deliver spontaneously. It is complete, fused and the cords both have 3 vessels. Uterine revision is negative.  We proceed with closure of the myometrium in 2 layers: First with a running locked suture of 0 Vicryl, then with a Lembert suture of 0 Vicryl imbricating the first one. Hemostasis is completed with cauterization on peritoneal edges.  A small hematoma on the left lower aspect of the uterine incision is observed and stable.  Both paracolic gutters are cleaned. Both tubes and ovaries are assessed and normal.  We confirm a satisfactory hemostasis.  Retractors and sponges are removed. Under fascia hemostasis is completed with cauterization.  The parietal peritoneum is closed with a running suture of Vicryl 2-0.  The fascia is then closed with 2 running sutures of 0 Vicryl meeting midline. The wound is irrigated with warm saline and hemostasis is completed with cauterization. The adipose tissue is closed with a running suture of Plain 2-0.  The skin is reapproximated with staples.  A Honeycomb dressing and pressure dressings are applied.  Instrument and sponge count is complete x2. Estimated blood  loss is 800 cc.  The procedure is well tolerated by the patient who is taken to recovery room in a well and stable condition.  The female baby A was born at 8:10 am and received an Apgar of 8  at 1 minute and 8 at 5 minutes.  The female baby B was born at 8:11 am and received an Apgar of 7 at 1 minute and 9 at 5 minutes.  Weight was pending for both.    Specimen: Placenta sent to Pathology.   Fredrich Cory,MARIE-LYNE MD 1/31/20158:51 AM

## 2013-09-21 NOTE — Lactation Note (Signed)
This note was copied from the chart of Savannah Arnold. Lactation Consultation Note  Patient Name: Savannah Fredonia Arnold Today's Date: 09/21/2013 Reason for consult: Initial assessment Baby 14 hours old. Mom reports baby being sleepy and not latching well. Reviewed BF basics. Enc STS, feeding with cues, and hand massage and expression. Assisted mom to latch baby twice. Baby has a good suck reflex, but does not sustain a latch. Discuss starting a DEBP with mom the next morning to protect milk supply. Mom aware of issues with late pre-term baby, baby is [redacted] weeks gestation. Enc mom to put baby to breast STS, undressing baby to enc waking and latching. Mom given [facility] brochure and aware of OP/BFSG services. Mom enc to ask for assistance as needed. Consulted with [facility] nurse regarding starting a DEBP in the am. Left note for morning LC as well.  Maternal Data Formula Feeding for Exclusion: No Has patient been taught Hand Expression?: Yes Does the patient have breastfeeding experience prior to this delivery?: Yes  Feeding Feeding Type: Breast Fed  LATCH Score/Interventions Latch: Repeated attempts needed to sustain latch, nipple held in mouth throughout feeding, stimulation needed to elicit sucking reflex. (Discussed starting a DEBP in the morning.) Intervention(s): Skin to skin;Teach feeding cues;Waking techniques Intervention(s): Adjust position;Assist with latch;Breast massage;Breast compression  Audible Swallowing: None Intervention(s): Skin to skin;Hand expression  Type of Nipple: Everted at rest and after stimulation  Comfort (Breast/Nipple): Soft / non-tender     Hold (Positioning): Assistance needed to correctly position infant at breast and maintain latch.  LATCH Score: 6  Lactation Tools Discussed/Used Tools: Pump Breast pump type: Manual   Consult Status Consult Status: Follow-up Date: 09/22/13 Follow-up type: In-patient    Cesario Weidinger 09/21/2013, 10:38 PM    

## 2013-09-21 NOTE — Lactation Note (Signed)
This note was copied from the chart of Savannah Arnold. Lactation Consultation Note  Patient Name: Savannah Arnold Today's Date: 09/21/2013 Reason for consult: Initial assessment;Late preterm infant;Multiple gestation Twins, baby 14 hours old. Mom concerned that baby keeps falling asleep at the breast. Assisted mom to latch baby in the football position. Baby able to latch on several times, but did not sustain a latch for long, and did little sucking. Discussed with mom that it would be good to begin pumping with a DEBP. Mom agreed with the idea. Enc mom to put the baby to the breast often. Reviewed BF basics. Mom given [facility] brochure and aware of OP/BFSG services. Mom enc to call out for assistance. Reviewed plan with [facility] nurse to begin pumping in the morning.   Maternal Data Formula Feeding for Exclusion: No Has patient been taught Hand Expression?: Yes Does the patient have breastfeeding experience prior to this delivery?: Yes  Feeding Feeding Type: Breast Fed Length of feed: 3 min (off and on.)  LATCH Score/Interventions Latch: Repeated attempts needed to sustain latch, nipple held in mouth throughout feeding, stimulation needed to elicit sucking reflex. Intervention(s): Skin to skin;Teach feeding cues;Waking techniques Intervention(s): Adjust position;Assist with latch;Breast massage;Breast compression  Audible Swallowing: None Intervention(s): Skin to skin;Hand expression  Type of Nipple: Everted at rest and after stimulation  Comfort (Breast/Nipple): Soft / non-tender     Hold (Positioning): Assistance needed to correctly position infant at breast and maintain latch. Intervention(s): Breastfeeding basics reviewed;Support Pillows;Position options;Skin to skin  LATCH Score: 6  Lactation Tools Discussed/Used Tools: Pump Breast pump type: Manual Date initiated:: 09/22/13   Consult Status Consult Status: Follow-up Date: 09/22/13 Follow-up type: In-patient    Zong Mcquarrie,  Zaara Sprowl 09/21/2013, 10:46 PM    

## 2013-09-21 NOTE — Anesthesia Preprocedure Evaluation (Addendum)
Anesthesia Evaluation  Patient identified by MRN, date of birth, ID band Patient awake    Reviewed: Allergy & Precautions, H&P , NPO status , Patient's Chart, lab work & pertinent test results  Airway Mallampati: II TM Distance: >3 FB Neck ROM: Full    Dental no notable dental hx.    Pulmonary asthma ,  breath sounds clear to auscultation  Pulmonary exam normal       Cardiovascular negative cardio ROS  Rhythm:Regular Rate:Normal     Neuro/Psych negative neurological ROS  negative psych ROS   GI/Hepatic negative GI ROS, Neg liver ROS,   Endo/Other  negative endocrine ROS  Renal/GU negative Renal ROS  negative genitourinary   Musculoskeletal negative musculoskeletal ROS (+)   Abdominal   Peds negative pediatric ROS (+)  Hematology negative hematology ROS (+)   Anesthesia Other Findings   Reproductive/Obstetrics (+) Pregnancy pruritic urticarial papules and plaques of pregnancy PUPPP                          Anesthesia Physical Anesthesia Plan  ASA: II  Anesthesia Plan: Spinal   Post-op Pain Management:    Induction:   Airway Management Planned:   Additional Equipment:   Intra-op Plan:   Post-operative Plan:   Informed Consent: I have reviewed the patients History and Physical, chart, labs and discussed the procedure including the risks, benefits and alternatives for the proposed anesthesia with the patient or authorized representative who has indicated his/her understanding and acceptance.   Dental advisory given  Plan Discussed with: CRNA  Anesthesia Plan Comments: (Discussed intrathecal narcotics for pain control. May worsen pruritis. Pt understands. Will also try IV steroids after cord clamp )       Anesthesia Quick Evaluation

## 2013-09-21 NOTE — H&P (Signed)
Subjective:  Savannah Arnold is a 28 y.o. G2 P1 female with EDC 10/18/13 at 37 and 0/[redacted] weeks gestation who is being admitted for C-section.  Her current obstetrical history is significant for severe PUPPS, 2nd twin Breech.  Patient reports no complaints.   Fetal Movement: normal  X 2.       Objective:   Vital signs in last 24 hours: Temp:  [98.4 F (36.9 C)] 98.4 F (36.9 C) (01/30 1134) Pulse Rate:  [81] 81 (01/30 1134) Resp:  [20] 20 (01/30 1134) BP: (115)/(74) 115/74 mmHg (01/30 1134) SpO2:  [99 %] 99 % (01/30 1134) Weight:  [79.379 kg (175 lb)] 79.379 kg (175 lb) (01/30 1134)   General:   alert  Skin:   normal  HEENT:  PERRLA  Lungs:   clear to auscultation bilaterally  Heart:   regularly irregular rhythm  Breasts:   Deferred  Abdomen:  Gravid  Pelvis:  Exam deferred.  FHT:  140's BPM  Uterine Size: Appropriate for twin gestation  Presentations: cephalic/breech  Cervix:    Dilation: 1cm   Effacement: Long   Station:  -3   Consistency: medium   Position: posterior   Lab Review  Rh+  Quad test:NML, US anato wnl, resolved isolated CPC.  One hour GTT: Normal   GBS neg   Assessment/Plan:  37 and 0/[redacted] weeks gestation. Not in labor. Obstetrical history significant for breech presentation and severe PUPPS.     Risks, benefits, alternatives and possible complications have been discussed in detail with the patient.  Pre-admission, admission, and post admission procedures and expectations were discussed in detail.  All questions answered, all appropriate consents will be signed at the Hospital. Admission is planned for today.  Primary elective C/S.  Informed consent obtained and signed.

## 2013-09-21 NOTE — Anesthesia Postprocedure Evaluation (Signed)
  Anesthesia Post-op Note  Patient: Savannah Arnold  Procedure(s) Performed: Procedure(s) (LRB): Primary CESAREAN SECTION  Twins (N/A)  Patient Location: PACU  Anesthesia Type: Spinal  Level of Consciousness: awake and alert   Airway and Oxygen Therapy: Patient Spontanous Breathing  Post-op Pain: mild  Post-op Assessment: Post-op Vital signs reviewed, Patient's Cardiovascular Status Stable, Respiratory Function Stable, Patent Airway and No signs of Nausea or vomiting  Last Vitals:  Filed Vitals:   09/21/13 0903  Pulse: 64  Temp: 36.6 C    Post-op Vital Signs: stable   Complications: No apparent anesthesia complications

## 2013-09-22 LAB — CBC
HCT: 26.8 % — ABNORMAL LOW (ref 36.0–46.0)
Hemoglobin: 9.2 g/dL — ABNORMAL LOW (ref 12.0–15.0)
MCH: 32.3 pg (ref 26.0–34.0)
MCHC: 34.7 g/dL (ref 30.0–36.0)
MCV: 93.1 fL (ref 78.0–100.0)
PLATELETS: 123 10*3/uL — AB (ref 150–400)
RBC: 2.88 MIL/uL — ABNORMAL LOW (ref 3.87–5.11)
RDW: 14.2 % (ref 11.5–15.5)
WBC: 10.7 10*3/uL — ABNORMAL HIGH (ref 4.0–10.5)

## 2013-09-22 NOTE — Lactation Note (Signed)
This note was copied from the chart of Savannah Agnieszka Arnold. Lactation Consultation Note  Patient Name: Savannah Arnold Today's Date: 09/22/2013 Reason for consult: Follow-up assessment;Difficult latch;Infant < 6lbs;Late preterm infant;Multiple gestation Baby 34 hours old. Mom reports baby falls asleep when she attempts to latch. Assisted mom to attempt to latch baby to the breast. Gave baby several drops of colostrum, but baby still not latching. Woke baby several times, but still would not latch. Enc mom to continue to attempt to latch baby for another few minutes, then to relax and post-pump with DEBP. Depending on how much mom is able to pump, may need to supplement with formula.   Maternal Data    Feeding Feeding Type: Breast Fed Length of feed: 25 min  LATCH Score/Interventions Latch: Too sleepy or reluctant, no latch achieved, no sucking elicited. Intervention(s): Skin to skin;Teach feeding cues;Waking techniques Intervention(s): Adjust position;Assist with latch;Breast massage;Breast compression  Audible Swallowing: None Intervention(s): Skin to skin;Hand expression Intervention(s): Skin to skin;Hand expression;Alternate breast massage  Type of Nipple: Everted at rest and after stimulation  Comfort (Breast/Nipple): Soft / non-tender     Hold (Positioning): No assistance needed to correctly position infant at breast. Intervention(s): Breastfeeding basics reviewed  LATCH Score: 6  Lactation Tools Discussed/Used Tools: Pump Breast pump type: Double-Electric Breast Pump   Consult Status Consult Status: Follow-up Date: 09/23/13 Follow-up type: In-patient    Samy Ryner 09/22/2013, 6:35 PM    

## 2013-09-22 NOTE — Progress Notes (Signed)
Patient ID: Savannah Arnold, female   DOB: 1986-03-11, 28 y.o.   MRN: [id number] POD # 1  Subjective: Pt reports feeling sore, but well/ Pain controlled with ibuprofen and percocet Tolerating po/ Foley d/c'ed and voiding without problems/ No n/v/Flatus small amt Activity: out of bed and ambulate in room Bleeding is light Newborn info:  Information for the patient's newborn:  Savannah Arnold, Girl Savannah Arnold [[id number]][[id number]]  female Information for the patient's newborn:  Savannah Arnold, GirlB Savannah Arnold [[id number]][[id number]]  female Feeding: breast   Objective: VS: Blood pressure 109/65, pulse 63, temperature 98.1 F (36.7 C), temperature source Axillary, resp. rate 16.    Intake/Output Summary (Last 24 hours) at 09/22/13 1031 Last data filed at 09/22/13 [id number]  Gross per 24 hour  Intake    100 ml  Output   1975 ml  Net  -1875 ml      Recent Labs  09/20/13 1155 09/22/13 0629  WBC 8.8 10.7*  HGB 11.5* 9.2*  HCT 32.1* 26.8*  PLT 154 123*    Blood type: O POS Rubella: Immune    Physical Exam:  General: alert, cooperative and no distress CV: Regular rate and rhythm Resp: clear Abdomen: soft, nontender, normal bowel sounds Incision: covered with large occlusive dressing Uterine Fundus: firm, below umbilicus, nontender Lochia: minimal Ext: edema trace and Homans sign is negative, no sign of DVT    A/P: POD # 1/ G2P2003 Mild ABL Anemia S/P C/Section d/t twin, malpresentation Doing well Continue routine post op orders   Signed: Demetrius RevelFISHER,Magic Mohler K, MSN, [facility] Center[facility]NP 09/22/2013, 10:31 AM

## 2013-09-22 NOTE — Lactation Note (Signed)
This note was copied from the chart of Savannah Arnold. Lactation Consultation Note  Babies are now 4624 hours old and they have only latched for brief periods.  Discussed with parents normal late preterm feeding patterns and the importance of getting calories in babies today.  Plan will be for LC to work on assisting with feedings followed by post pumping with DEBP and giving any colostrum back to babies.  Also discussed with parents that we may need to give some additional formula if feedings at breast are not going well.  Assisted with Positioning Twin A at breast skin to skin in football hold.  Hand expression demonstrated and one small drop visible.  After some suck training on gloved finger baby did latch and nursed fairly well x 20 minutes.  Patient Name: Savannah Arnold UVOZD'GToday's Date: 09/22/2013 Reason for consult: Follow-up assessment;Infant < 6lbs;Late preterm infant;Multiple gestation   Maternal Data    Feeding Feeding Type: Breast Fed Length of feed: 20 min  LATCH Score/Interventions Latch: Grasps breast easily, tongue down, lips flanged, rhythmical sucking. Intervention(s): Skin to skin;Teach feeding cues;Waking techniques Intervention(s): Adjust position;Assist with latch;Breast massage;Breast compression  Audible Swallowing: A few with stimulation Intervention(s): Skin to skin;Hand expression;Alternate breast massage  Type of Nipple: Everted at rest and after stimulation  Comfort (Breast/Nipple): Soft / non-tender     Hold (Positioning): Assistance needed to correctly position infant at breast and maintain latch.  LATCH Score: 8  Lactation Tools Discussed/Used Breast pump type: Double-Electric Breast Pump Pump Review: Setup, frequency, and cleaning;Milk Storage Initiated by:: LPOWELL RN, IBCLC Date initiated:: 09/23/13   Consult Status Consult Status: Follow-up Date: 09/23/13 Follow-up type: In-patient    Hansel Feinsteinowell, Madicyn Mesina Ann 09/22/2013, 10:11 AM

## 2013-09-22 NOTE — Lactation Note (Signed)
This note was copied from the chart of Savannah Arnold. Lactation Consultation Note  Patient Name: Savannah Arnold Today's Date: 09/22/2013 Reason for consult: Follow-up assessment;Multiple gestation;Late preterm infant;Difficult latch Baby 34 hours old. Mom reports difficulty latching baby. Assisted to attempt to latch. Baby would latch and suck but fall asleep and stop. Baby did get drops of hand expressed colostrum. Baby would suck when elicited with finger. Baby swallowed expressed colostrum with massage of breast while nipple in mouth, but did not effectively suck. Enc mom to attempt to nurse twin B, then relax and massage breast prior to DEBP. Depending on the amount of colostrum mom is able to pump and babies' weights tonight, may need to supplement with formula.  Maternal Data    Feeding Feeding Type: Breast Fed  LATCH Score/Interventions Latch: Too sleepy or reluctant, no latch achieved, no sucking elicited. Intervention(s): Skin to skin;Teach feeding cues;Waking techniques Intervention(s): Adjust position;Assist with latch;Breast massage;Breast compression  Audible Swallowing: A few with stimulation Intervention(s): Skin to skin;Hand expression Intervention(s): Skin to skin;Hand expression;Alternate breast massage  Type of Nipple: Everted at rest and after stimulation  Comfort (Breast/Nipple): Soft / non-tender     Hold (Positioning): No assistance needed to correctly position infant at breast.  LATCH Score: 7  Lactation Tools Discussed/Used Tools: Pump Breast pump type: Double-Electric Breast Pump   Consult Status Consult Status: Follow-up Date: 09/23/13 Follow-up type: In-patient    Theordore Cisnero 09/22/2013, 6:27 PM    

## 2013-09-22 NOTE — Lactation Note (Signed)
This note was copied from the chart of Savannah Arnold. Lactation Consultation Note  Patient Name: Savannah Arnold OZHYQ'MToday's Date: 09/22/2013 Reason for consult: Follow-up assessment;Infant < 6lbs;Late preterm infant;Multiple gestation Assisted mom to feed baby at breast using #24 nipple shield and curve tipped syringe. Baby to 1.5 ml colostrum and 8.5 ml formula. Baby tolerated well. Assisted dad to help mom with feeding. Both mom and dad able to feed baby on their own. Enc mom to feed based on cues, and better to feed the baby's sooner rather than feed more than what the supplemental feeding chart recommends since babies haven't had much in their tummies yet. Mom and dad content with plan to offer the breast first then supplement with nipple shield. Enc mom to make sure to have a follow-up appointment with Lactation office if she leaves hospital still using nipple shield in order to assist with transitioning to nursing with NS. Enc mom to call out for assistance as needed. Consulted with [facility] nurse about patient's feeding plan. Enc mom to pump with every feeding to protect milk supply.  Maternal Data    Feeding Feeding Type: Breast Milk with Formula added  LATCH Score/Interventions Latch: Grasps breast easily, tongue down, lips flanged, rhythmical sucking. Intervention(s): Skin to skin;Teach feeding cues;Waking techniques Intervention(s): Adjust position;Assist with latch;Breast massage;Breast compression  Audible Swallowing: Spontaneous and intermittent Intervention(s): Skin to skin;Hand expression Intervention(s): Skin to skin;Hand expression;Alternate breast massage  Type of Nipple: Flat  Comfort (Breast/Nipple): Soft / non-tender     Hold (Positioning): No assistance needed to correctly position infant at breast.  LATCH Score: 9  Lactation Tools Discussed/Used Tools: Nipple Shields Nipple shield size: 24 Breast pump type: Double-Electric Breast Pump   Consult Status Consult  Status: Follow-up Date: 09/23/13 Follow-up type: In-patient    Savannah Arnold, Savannah Arnold 09/22/2013, 8:59 PM

## 2013-09-22 NOTE — Anesthesia Postprocedure Evaluation (Signed)
  Anesthesia Post-op Note  Patient: Savannah Arnold  Procedure(s) Performed: Procedure(s) with comments: Primary CESAREAN SECTION  Twins (N/A) - EDD: 10/12/13  Patient Location: Mother/Baby  Anesthesia Type:Spinal  Level of Consciousness: awake  Airway and Oxygen Therapy: Patient Spontanous Breathing  Post-op Pain: none  Post-op Assessment: Patient's Cardiovascular Status Stable, Respiratory Function Stable, Patent Airway, No signs of Nausea or vomiting, Adequate PO intake, Pain level controlled, No headache, No backache, No residual numbness and No residual motor weakness  Post-op Vital Signs: Reviewed and stable  Complications: No apparent anesthesia complications

## 2013-09-22 NOTE — Lactation Note (Signed)
This note was copied from the chart of Savannah Debe Arnold. Lactation Consultation Note   Assisted with placing baby B skin to skin in football hold.  After some suck training on gloved finger baby did latch and nurse well.  LC will assist mom with DEBP/hand expression after she finishes her breakfast.  LC phone number given for assist today.  Patient Name: Savannah Arnold Today's Date: 09/22/2013 Reason for consult: Follow-up assessment;Infant < 6lbs;Late preterm infant;Multiple gestation   Maternal Data    Feeding Feeding Type: Breast Fed  LATCH Score/Interventions Latch: Grasps breast easily, tongue down, lips flanged, rhythmical sucking. Intervention(s): Skin to skin;Teach feeding cues;Waking techniques Intervention(s): Adjust position;Assist with latch;Breast massage;Breast compression  Audible Swallowing: A few with stimulation Intervention(s): Skin to skin;Hand expression Intervention(s): Alternate breast massage;Hand expression;Skin to skin  Type of Nipple: Everted at rest and after stimulation  Comfort (Breast/Nipple): Soft / non-tender     Hold (Positioning): Assistance needed to correctly position infant at breast and maintain latch. Intervention(s): Breastfeeding basics reviewed;Support Pillows;Position options;Skin to skin  LATCH Score: 8  Lactation Tools Discussed/Used     Consult Status      Powell, Mayzee Reichenbach Ann 09/22/2013, 10:22 AM    

## 2013-09-23 ENCOUNTER — Encounter (HOSPITAL_COMMUNITY): Payer: Self-pay | Admitting: Obstetrics & Gynecology

## 2013-09-23 LAB — T.PALLIDUM AB, IGG: T pallidum Antibodies (TP-PA): 0.11 S/CO (ref <0.90)

## 2013-09-23 NOTE — Lactation Note (Signed)
This note was copied from the chart of Savannah Arnold. Lactation Consultation Note  Reviewed moms chart with RN for LC plan of care at 60 hours of age.  Mom requests comfort gels because she has lost hers.  Lc to follow tomorrow.   Patient Name: Savannah Arnold Today's Date: 09/23/2013     Maternal Data    Feeding Feeding Type: Bottle Fed - Breast Milk  LATCH Score/Interventions                      Lactation Tools Discussed/Used     Consult Status      Shoptaw, Jana Lynn 09/23/2013, 9:07 PM    

## 2013-09-23 NOTE — Progress Notes (Signed)
POD # 2  Subjective: Pt reports feeling well, a little sore/ Pain controlled with Motrin and Percocet Tolerating po/Voiding without problems/ No n/v/ Flatus present Activity: ad lib Bleeding is light Newborn info:  Information for the patient's newborn:  Gean Quintobin, Girl Lotus [[id number]][[id number]]  female Information for the patient's newborn:  Kerin Ransomobin, GirlB Kyliah [[id number]][[id number]]  female  / Circumcision: n/a/ Feeding: breast   Objective: VS:  Filed Vitals:   09/22/13 0435 09/22/13 0620 09/22/13 1808 09/23/13 0625  BP: 119/60 109/65 108/69 93/58  Pulse: 59 63 69 56  Temp: 98.6 F (37 C) 98.1 F (36.7 C) 98.1 F (36.7 C) 98.1 F (36.7 C)  TempSrc: Oral Axillary Oral Oral  Resp: 18 16 18 18   Weight:      SpO2: 97%       I&O: Intake/Output     02/01 0701 - 02/02 0700 02/02 0701 - 02/03 0700   I.V. (mL/kg)     Total Intake(mL/kg)     Urine (mL/kg/hr)     Blood     Total Output       Net              LABS:  Recent Labs  09/20/13 1155 09/22/13 0629  WBC 8.8 10.7*  HGB 11.5* 9.2*  HCT 32.1* 26.8*  PLT 154 123*    Blood type: --/--/O POS, O POS (01/30 1155) Rubella: Immune (07/25 0000)     Physical Exam:  General: alert and cooperative CV: Regular rate and rhythm Resp: CTA bilaterally Abdomen: soft, nontender, normal bowel sounds Uterine Fundus: firm, below umbilicus, nontender Incision: Covered with Tegaderm and honeycomb dressing; well approximated, small amt old blood Lochia: minimal Ext: extremities normal, atraumatic, no cyanosis or edema and Homans sign is negative, no sign of DVT    Assessment/: POD # 2/ G2P2003/ S/P C/Section d/t elective, twins IDA with compounding ABL anemia Doing well  Plan: Continue routine post op orders Anticipate discharge home tomorrow   Signed: Donette LarryBHAMBRI, Hebe Merriwether, Dorris CarnesN, MSN, CNM 09/23/2013, 11:43 AM

## 2013-09-23 NOTE — Lactation Note (Signed)
This note was copied from the chart of Savannah Rozetta Arnold. Lactation Consultation Note  Patient Name: Savannah Arnold Today's Date: 09/23/2013 Reason for consult: Follow-up assessment;Multiple gestation;Infant < 6lbs Mom had baby latched when I arrived. Demonstrating a good rhythmic suck, few swallows noted. Baby was given some EBM prior to feeding. Mom has feeding plan.   Maternal Data    Feeding Feeding Type: Breast Fed  LATCH Score/Interventions Latch: Grasps breast easily, tongue down, lips flanged, rhythmical sucking.  Audible Swallowing: A few with stimulation  Type of Nipple: Everted at rest and after stimulation  Comfort (Breast/Nipple): Soft / non-tender     Hold (Positioning): No assistance needed to correctly position infant at breast.  LATCH Score: 9  Lactation Tools Discussed/Used Tools: Pump Breast pump type: Double-Electric Breast Pump   Consult Status Consult Status: Follow-up Date: 09/24/13 Follow-up type: In-patient    Hamzah Savoca Ann 09/23/2013, 4:14 PM    

## 2013-09-23 NOTE — Lactation Note (Signed)
This note was copied from the chart of Savannah Arnold. Lactation Consultation Note  Patient Name: Savannah Arnold Today's Date: 09/23/2013 Reason for consult: Follow-up assessment;Infant < 6lbs;Multiple gestation;Difficult latch Mom had baby at the breast when LC arrived. Baby demonstrating a good rhythmic suck on left breast using #20 nipple shield. Baby at 10% weight loss, scant amount of colostrum noted in nipple shield. Demonstrated how to set up and clean SNS. Baby took 12 ml of formula from SNS at breast with nipple shield.  Discussed feeding plan with parents to include supplementing till Mom's milk comes in Baby A. Baby B weight loss at 6% and Mom reports is latching well. Parents express concern over the many tools to keep baby at the breast. Discussed with parents several feeding options and parents decided to work with the following plan today. Mom will breastfeed each feeding, keep baby actively nursing for 15 minutes use #20 nipple shield to latch. Supplement according to guidelines given via bottle with slow flow nipple. Post pump 4-6 times per day to encourage milk production. Give baby EBM for supplement when available.  For Baby B, Mom will follow same plan with exception of supplementing as needed based on weight loss, voids/stools. Encouraged to make OP follow up after discharge home. Advised to call for assist as needed with feedings.   Maternal Data    Feeding Feeding Type: Formula Length of feed: 10 min  LATCH Score/Interventions Latch: Grasps breast easily, tongue down, lips flanged, rhythmical sucking. (using #20 nipple shield)  Audible Swallowing: Spontaneous and intermittent (using SNS at breast to supplement)  Type of Nipple: Everted at rest and after stimulation  Comfort (Breast/Nipple): Soft / non-tender     Hold (Positioning): Assistance needed to correctly position infant at breast and maintain latch.  LATCH Score: 9  Lactation Tools  Discussed/Used Tools: Nipple Shields;Pump;Supplemental Nutrition System;5F feeding tube / Syringe;Flanges Nipple shield size: 20 Flange Size: 36 Breast pump type: Double-Electric Breast Pump   Consult Status Consult Status: Follow-up Date: 09/24/13 Follow-up type: In-patient    Oshay Stranahan Ann 09/23/2013, 12:09 PM    

## 2013-09-24 ENCOUNTER — Encounter (HOSPITAL_COMMUNITY): Payer: Self-pay

## 2013-09-24 MED ORDER — HYDROXYZINE PAMOATE 25 MG PO CAPS: 25.0000 mg | ORAL_CAPSULE | Freq: Four times a day (QID) | ORAL | Status: DC | PRN

## 2013-09-24 MED ORDER — OXYCODONE-ACETAMINOPHEN 5-325 MG PO TABS: 1.0000 | ORAL_TABLET | ORAL | Status: DC | PRN

## 2013-09-24 MED ORDER — IBUPROFEN 600 MG PO TABS: 600.0000 mg | ORAL_TABLET | Freq: Four times a day (QID) | ORAL | Status: DC

## 2013-09-24 MED ORDER — FERROUS SULFATE 325 (65 FE) MG PO TABS: 325.0000 mg | ORAL_TABLET | Freq: Two times a day (BID) | ORAL | Status: DC

## 2013-09-24 NOTE — Discharge Summary (Signed)
Obstetric Discharge Summary Reason for Admission: cesarean section for twins and malpresentation of twin B Prenatal Procedures: NST and ultrasound Intrapartum Procedures: cesarean: low cervical, transverse Postpartum Procedures: none Complications-Operative and Postpartum: IDA with compounding ABL Anemia Hemoglobin  Date Value Range Status  09/22/2013 9.2* 12.0 - 15.0 g/dL Final     REPEATED TO VERIFY     DELTA CHECK NOTED     HCT  Date Value Range Status  09/22/2013 26.8* 36.0 - 46.0 % Final    Physical Exam:  General: alert, cooperative and no distress Lochia: appropriate, light Uterine Fundus: firm, midline, U-1 Incision: Tegaderm and Honeycomb dressings intact, small dried blood on Honeycomb - skin well-approximated with staples DVT Evaluation: No evidence of DVT seen on physical exam. Negative Homan's sign. No cords or calf tenderness. Calf/Ankle trace edema is present.  Discharge Diagnoses: Term Di-Di Twin Pregnancy-delivered        IDA with compounding ABL Anemia Discharge Information: Date: 09/24/2013 Activity: pelvic rest Diet: routine Medications: PNV, Ibuprofen, Iron and Vistaril Condition: stable Instructions: refer to practice specific booklet, Soak in [place] Oatmeal bath after incision has healed (approximately 2 weeks) Discharge to: home Follow-up Information   [facility] Schedule an appointment as soon as possible for a visit on 09/27/2013.   Contact information:   [address] [place] [id number] [phone]      Follow up with LAVOIE,MARIE-LYNE, MD. Schedule an appointment as soon as possible for a visit in 6 weeks.   Specialty:  Obstetrics and Gynecology   Contact information:   [address] [place] [id number] [phone]       Newborn Data:   Gean Quintobin, Girl Laila [[id number]][[id number]]  Live born female on 09/21/2015 Birth Weight: 6 lb 1.4 oz (2761 g) APGAR: 8, 8   Kerin Ransomobin, GirlB Kerly [[id number]][[id number]]  Live  born female on [birth year]/21/2015 Birth Weight: 5 lb 12.4 oz (2620 g) APGAR: 7, 9  Home with mother. *Dr. Ernestina PennaFogleman consulted on treatment of postpartum itching   Kenard GowerAWSON, Meril Dray, M, MSN, CNM 09/24/2013, 8:49 AM

## 2013-09-24 NOTE — Discharge Instructions (Signed)
Breast Pumping Tips °Pumping your breast milk is a good way to stimulate milk production and have a steady supply of breast milk for your infant. Pumping is most helpful during your infant's growth spurts, when involving dad or a family member, or when you are away. There are several types of pumps available. They can be purchased at a baby or maternity store. You can begin pumping soon after delivery, but some experts believe that you should wait about four weeks to give your infant a bottle. °In general, the more you breastfeed or pump, the more milk you will have for your infant. It is also important to take good care of yourself. This will reduce stress and help your body to create a healthy supply of milk. Your caregiver or lactation consultant can give you the information and support you need in your efforts to breastfeed your infant. °PUMPING BREAST MILK  °Follow the tips below for successful breast pumping. °Take care of yourself. °· Drink enough water or fluids to keep urine clear or pale yellow. You may notice a thirsty feeling while breastfeeding. This is because your body needs more water to make breast milk. Keep a large water bottle handy. Make healthy drink choices such as unsweetened fruit juice, milk and water. Limit soda, coffee, and alcohol (wait 2 hours to feed or pump if you have an alcoholic drink.) °· Eat a healthy, well-balanced diet rich in fruits, vegetables, and whole grains. °· Exercise as recommended by your caregiver. °· Get plenty of sleep. Sleep when your infant sleeps. Ask friends and family for help if you need time to nap or rest. °· Do not smoke. Smoking can lower your milk supply and harm your infant. If you need help quitting, ask your caregiver for a program recommendation. °· Ask your caregiver about birth control options. Birth control pills may lower your milk supply. You may be advised to use condoms or other forms of birth control. °Relax and pump °Stimulating your  let-down reflex is the key to successful and effective pumping. This makes the milk in all parts of the breast flow more freely.  °· It is easier to pump breast milk (and breastfeed) while you are relaxed. Find techniques that work for you. Quiet private spaces, breast massage, soothing heat placed on the breast, music, and pictures or a tape recording of your infant may help you to relax and "let down" your milk. If you have difficulty with your let down, try smelling one of your infant's blankets or an item of clothing he or she has worn while you are pumping. °· When pumping, place the special suction cup (flange) directly over the nipple. It may be uncomfortable and cause nipple damage if it is not placed properly or is the wrong size. Applying a small amount of purified or modified lanolin to your nipple and the areola may help increase your comfort level. Also, you can change the speed and suction of many electric pumps to your comfort level. Your caregiver or lactation consultant can help you with this. °· If pumping continues to be painful, or you feel you are not getting very much milk when you pump, you may need a different type of pump. A lactation consultant can help you determine if this is the case. °· If you are with your infant, feed him or her on demand and try pumping after each feeding. This will boost your production, even if milk does not come out. You may not be able   to pump much milk at first, but keep up the routine, and this will change.  If you are working or away from your infant for several hours, try pumping for about 15 minutes every 2 to 3 hours. Pump both breasts at the same time if you can.  If your infant has a formula feeding, make sure you pump your milk around the same time to maintain your supply.  Begin pumping breast milk a few weeks before you return to work. This will help you develop techniques that work for you and will be able to store extra milk.  Find a source  of breastfeeding information that works well for you. TIPS FOR STORING BREAST MILK  Store breast milk in a sealable sterile bag, jar, or container provided with your pumping supplies.  Store milk in small amounts close to what your infant is drinking at each feeding.  Cool pumped milk in a refrigerator or cooler. Pumped milk can last at the back of the refrigerator for 3 to 8 days.  Place cooled milk at the back of the freezer for up to 3 months.  Thaw the milk in its container or bag in warm water up to 24 hours in advance. Do not use a microwave to thaw or heat milk. Do not refreeze the milk after it has been thawed.  Breast milk is safe to drink when left at room temperature (mid 70s or colder) for 4 to 8 hours. After that, throw it away.  Milk fat can separate and look funny. The color can vary slightly from day to day. This is normal. Always shake the milk before using it to mix the fat with the more watery portion. SEEK MEDICAL CARE IF:   You are having trouble pumping or feeding your infant.  You are concerned that you are not making enough milk.  You have nipple pain, soreness, or redness.  You have other questions or concerns related to you or your infant. Document Released: 01/26/2010 Document Revised: 10/31/2011 Document Reviewed: 01/26/2010 [facility] Patient Information 2014 [place].  Breastfeeding Deciding to breastfeed is one of the best choices you can make for you and your baby. A change in hormones during pregnancy causes your breast tissue to grow and increases the number and size of your milk ducts. These hormones also allow proteins, sugars, and fats from your blood supply to make breast milk in your milk-producing glands. Hormones prevent breast milk from being released before your baby is born as well as prompt milk flow after birth. Once breastfeeding has begun, thoughts of your baby, as well as his or her sucking or crying, can stimulate the release of milk  from your milk-producing glands.  BENEFITS OF BREASTFEEDING For Your Baby  Your first milk (colostrum) helps your baby's digestive system function better.   There are antibodies in your milk that help your baby fight off infections.   Your baby has a lower incidence of asthma, allergies, and sudden infant death syndrome.   The nutrients in breast milk are better for your baby than infant formulas and are designed uniquely for your baby's needs.   Breast milk improves your baby's brain development.   Your baby is less likely to develop other conditions, such as childhood obesity, asthma, or type 2 diabetes mellitus.  For You   Breastfeeding helps to create a very special bond between you and your baby.   Breastfeeding is convenient. Breast milk is always available at the correct temperature and costs  nothing.   Breastfeeding helps to burn calories and helps you lose the weight gained during pregnancy.   Breastfeeding makes your uterus contract to its prepregnancy size faster and slows bleeding (lochia) after you give birth.   Breastfeeding helps to lower your risk of developing type 2 diabetes mellitus, osteoporosis, and breast or ovarian cancer later in life. SIGNS THAT YOUR BABY IS HUNGRY Early Signs of Hunger  Increased alertness or activity.  Stretching.  Movement of the head from side to side.  Movement of the head and opening of the mouth when the corner of the mouth or cheek is stroked (rooting).  Increased sucking sounds, smacking lips, cooing, sighing, or squeaking.  Hand-to-mouth movements.  Increased sucking of fingers or hands. Late Signs of Hunger  Fussing.  Intermittent crying. Extreme Signs of Hunger Signs of extreme hunger will require calming and consoling before your baby will be able to breastfeed successfully. Do not wait for the following signs of extreme hunger to occur before you initiate breastfeeding:   Restlessness.  A loud,  strong cry.   Screaming. BREASTFEEDING BASICS Breastfeeding Initiation  Find a comfortable place to sit or lie down, with your neck and back well supported.  Place a pillow or rolled up blanket under your baby to bring him or her to the level of your breast (if you are seated). Nursing pillows are specially designed to help support your arms and your baby while you breastfeed.  Make sure that your baby's abdomen is facing your abdomen.   Gently massage your breast. With your fingertips, massage from your chest wall toward your nipple in a circular motion. This encourages milk flow. You may need to continue this action during the feeding if your milk flows slowly.  Support your breast with 4 fingers underneath and your thumb above your nipple. Make sure your fingers are well away from your nipple and your baby's mouth.   Stroke your baby's lips gently with your finger or nipple.   When your baby's mouth is open wide enough, quickly bring your baby to your breast, placing your entire nipple and as much of the colored area around your nipple (areola) as possible into your baby's mouth.   More areola should be visible above your baby's upper lip than below the lower lip.   Your baby's tongue should be between his or her lower gum and your breast.   Ensure that your baby's mouth is correctly positioned around your nipple (latched). Your baby's lips should create a seal on your breast and be turned out (everted).  It is common for your baby to suck about 2 3 minutes in order to start the flow of breast milk. Latching Teaching your baby how to latch on to your breast properly is very important. An improper latch can cause nipple pain and decreased milk supply for you and poor weight gain in your baby. Also, if your baby is not latched onto your nipple properly, he or she may swallow some air during feeding. This can make your baby fussy. Burping your baby when you switch breasts during the  feeding can help to get rid of the air. However, teaching your baby to latch on properly is still the best way to prevent fussiness from swallowing air while breastfeeding. Signs that your baby has successfully latched on to your nipple:    Silent tugging or silent sucking, without causing you pain.   Swallowing heard between every 3 4 sucks.    Muscle movement  above and in front of his or her ears while sucking.  Signs that your baby has not successfully latched on to nipple:   Sucking sounds or smacking sounds from your baby while breastfeeding.  Nipple pain. If you think your baby has not latched on correctly, slip your finger into the corner of your baby's mouth to break the suction and place it between your baby's gums. Attempt breastfeeding initiation again. Signs of Successful Breastfeeding Signs from your baby:   A gradual decrease in the number of sucks or complete cessation of sucking.   Falling asleep.   Relaxation of his or her body.   Retention of a small amount of milk in his or her mouth.   Letting go of your breast by himself or herself. Signs from you:  Breasts that have increased in firmness, weight, and size 1 3 hours after feeding.   Breasts that are softer immediately after breastfeeding.  Increased milk volume, as well as a change in milk consistency and color by the 5th day of breastfeeding.   Nipples that are not sore, cracked, or bleeding. Signs That Your Pecola LeisureBaby is Getting Enough Milk  Wetting at least 3 diapers in a 24-hour period. The urine should be clear and pale yellow by age 84 days.  At least 3 stools in a 24-hour period by age 84 days. The stool should be soft and yellow.  At least 3 stools in a 24-hour period by age 17 days. The stool should be seedy and yellow.  No loss of weight greater than 10% of birth weight during the first 323 days of age.  Average weight gain of 4 7 ounces (120 210 mL) per week after age 83 days.  Consistent  daily weight gain by age 84 days, without weight loss after the age of 2 weeks. After a feeding, your baby may spit up a small amount. This is common. BREASTFEEDING FREQUENCY AND DURATION Frequent feeding will help you make more milk and can prevent sore nipples and breast engorgement. Breastfeed when you feel the need to reduce the fullness of your breasts or when your baby shows signs of hunger. This is called "breastfeeding on demand." Avoid introducing a pacifier to your baby while you are working to establish breastfeeding (the first 4 6 weeks after your baby is born). After this time you may choose to use a pacifier. Research has shown that pacifier use during the first year of a baby's life decreases the risk of sudden infant death syndrome (SIDS). Allow your baby to feed on each breast as long as he or she wants. Breastfeed until your baby is finished feeding. When your baby unlatches or falls asleep while feeding from the first breast, offer the second breast. Because newborns are often sleepy in the first few weeks of life, you may need to awaken your baby to get him or her to feed. Breastfeeding times will vary from baby to baby. However, the following rules can serve as a guide to help you ensure that your baby is properly fed:  Newborns (babies 34 weeks of age or younger) may breastfeed every 1 3 hours.  Newborns should not go longer than 3 hours during the day or 5 hours during the night without breastfeeding.  You should breastfeed your baby a minimum of 8 times in a 24-hour period until you begin to introduce solid foods to your baby at around 476 months of age. BREAST MILK PUMPING Pumping and storing breast milk allows you  to ensure that your baby is exclusively fed your breast milk, even at times when you are unable to breastfeed. This is especially important if you are going back to work while you are still breastfeeding or when you are not able to be present during feedings. Your  lactation consultant can give you guidelines on how long it is safe to store breast milk.  A breast pump is a machine that allows you to pump milk from your breast into a sterile bottle. The pumped breast milk can then be stored in a refrigerator or freezer. Some breast pumps are operated by hand, while others use electricity. Ask your lactation consultant which type will work best for you. Breast pumps can be purchased, but some hospitals and breastfeeding support groups lease breast pumps on a monthly basis. A lactation consultant can teach you how to hand express breast milk, if you prefer not to use a pump.  CARING FOR YOUR BREASTS WHILE YOU BREASTFEED Nipples can become dry, cracked, and sore while breastfeeding. The following recommendations can help keep your breasts moisturized and healthy:  Avoid using soap on your nipples.   Wear a supportive bra. Although not required, special nursing bras and tank tops are designed to allow access to your breasts for breastfeeding without taking off your entire bra or top. Avoid wearing underwire style bras or extremely tight bras.  Air dry your nipples for 3 4minutes after each feeding.   Use only cotton bra pads to absorb leaked breast milk. Leaking of breast milk between feedings is normal.   Use lanolin on your nipples after breastfeeding. Lanolin helps to maintain your skin's normal moisture barrier. If you use pure lanolin you do not need to wash it off before feeding your baby again. Pure lanolin is not toxic to your baby. You may also hand express a few drops of breast milk and gently massage that milk into your nipples and allow the milk to air dry. In the first few weeks after giving birth, some women experience extremely full breasts (engorgement). Engorgement can make your breasts feel heavy, warm, and tender to the touch. Engorgement peaks within 3 5 days after you give birth. The following recommendations can help ease  engorgement:  Completely empty your breasts while breastfeeding or pumping. You may want to start by applying warm, moist heat (in the shower or with warm water-soaked hand towels) just before feeding or pumping. This increases circulation and helps the milk flow. If your baby does not completely empty your breasts while breastfeeding, pump any extra milk after he or she is finished.  Wear a snug bra (nursing or regular) or tank top for 1 2 days to signal your body to slightly decrease milk production.  Apply ice packs to your breasts, unless this is too uncomfortable for you.  Make sure that your baby is latched on and positioned properly while breastfeeding. If engorgement persists after 48 hours of following these recommendations, contact your health care provider or a Advertising copywriterlactation consultant. OVERALL HEALTH CARE RECOMMENDATIONS WHILE BREASTFEEDING  Eat healthy foods. Alternate between meals and snacks, eating 3 of each per day. Because what you eat affects your breast milk, some of the foods may make your baby more irritable than usual. Avoid eating these foods if you are sure that they are negatively affecting your baby.  Drink milk, fruit juice, and water to satisfy your thirst (about 10 glasses a day).   Rest often, relax, and continue to take your prenatal vitamins  to prevent fatigue, stress, and anemia.  Continue breast self-awareness checks.  Avoid chewing and smoking tobacco.  Avoid alcohol and drug use. Some medicines that may be harmful to your baby can pass through breast milk. It is important to ask your health care provider before taking any medicine, including all over-the-counter and prescription medicine as well as vitamin and herbal supplements. It is possible to become pregnant while breastfeeding. If birth control is desired, ask your health care provider about options that will be safe for your baby. SEEK MEDICAL CARE IF:   You feel like you want to stop breastfeeding  or have become frustrated with breastfeeding.  You have painful breasts or nipples.  Your nipples are cracked or bleeding.  Your breasts are red, tender, or warm.  You have a swollen area on either breast.  You have a fever or chills.  You have nausea or vomiting.  You have drainage other than breast milk from your nipples.  Your breasts do not become full before feedings by the 5th day after you give birth.  You feel sad and depressed.  Your baby is too sleepy to eat well.  Your baby is having trouble sleeping.   Your baby is wetting less than 3 diapers in a 24-hour period.  Your baby has less than 3 stools in a 24-hour period.  Your baby's skin or the white part of his or her eyes becomes yellow.   Your baby is not gaining weight by 71 days of age. SEEK IMMEDIATE MEDICAL CARE IF:   Your baby is overly tired (lethargic) and does not want to wake up and feed.  Your baby develops an unexplained fever. Document Released: 08/08/2005 Document Revised: 04/10/2013 Document Reviewed: 01/30/2013 [facility] Patient Information 2014 [place], [place]. Postpartum Depression and Baby Blues The postpartum period begins right after the birth of a baby. During this time, there is often a great amount of joy and excitement. It is also a time of considerable changes in the life of the parent(s). Regardless of how many times a mother gives birth, each child brings new challenges and dynamics to the family. It is not unusual to have feelings of excitement accompanied by confusing shifts in moods, emotions, and thoughts. All mothers are at risk of developing postpartum depression or the "baby blues." These mood changes can occur right after giving birth, or they may occur many months after giving birth. The baby blues or postpartum depression can be mild or severe. Additionally, postpartum depression can resolve rather quickly, or it can be a long-term condition. CAUSES Elevated hormones and their  rapid decline are thought to be a main cause of postpartum depression and the baby blues. There are a number of hormones that radically change during and after pregnancy. Estrogen and progesterone usually decrease immediately after delivering your baby. The level of thyroid hormone and various cortisol steroids also rapidly drop. Other factors that play a major role in these changes include major life events and genetics.  RISK FACTORS If you have any of the following risks for the baby blues or postpartum depression, know what symptoms to watch out for during the postpartum period. Risk factors that may increase the likelihood of getting the baby blues or postpartum depression include:  Havinga personal or family history of depression.  Having depression while being pregnant.  Having premenstrual or oral contraceptive-associated mood issues.  Having exceptional life stress.  Having marital conflict.  Lacking a social support network.  Having a baby with special  needs.  Having health problems such as diabetes. SYMPTOMS Baby blues symptoms include:  Brief fluctuations in mood, such as going from extreme happiness to sadness.  Decreased concentration.  Difficulty sleeping.  Crying spells, tearfulness.  Irritability.  Anxiety. Postpartum depression symptoms typically begin within the first month after giving birth. These symptoms include:  Difficulty sleeping or excessive sleepiness.  Marked weight loss.  Agitation.  Feelings of worthlessness.  Lack of interest in activity or food. Postpartum psychosis is a very concerning condition and can be dangerous. Fortunately, it is rare. Displaying any of the following symptoms is cause for immediate medical attention. Postpartum psychosis symptoms include:  Hallucinations and delusions.  Bizarre or disorganized behavior.  Confusion or disorientation. DIAGNOSIS  A diagnosis is made by an evaluation of your symptoms. There are  no medical or lab tests that lead to a diagnosis, but there are various questionnaires that a caregiver may use to identify those with the baby blues, postpartum depression, or psychosis. Often times, a screening tool called the [place] Postnatal Depression Scale is used to diagnose depression in the postpartum period.  TREATMENT The baby blues usually goes away on its own in 1 to 2 weeks. Social support is often all that is needed. You should be encouraged to get adequate sleep and rest. Occasionally, you may be given medicines to help you sleep.  Postpartum depression requires treatment as it can last several months or longer if it is not treated. Treatment may include individual or group therapy, medicine, or both to address any social, physiological, and psychological factors that may play a role in the depression. Regular exercise, a healthy diet, rest, and social support may also be strongly recommended.  Postpartum psychosis is more serious and needs treatment right away. Hospitalization is often needed. HOME CARE INSTRUCTIONS  Get as much rest as you can. Nap when the baby sleeps.  Exercise regularly. Some women find yoga and walking to be beneficial.  Eat a balanced and nourishing diet.  Do little things that you enjoy. Have a cup of tea, take a bubble bath, read your favorite magazine, or listen to your favorite music.  Avoid alcohol.  Ask for help with household chores, cooking, grocery shopping, or running errands as needed. Do not try to do everything.  Talk to people close to you about how you are feeling. Get support from your partner, family members, friends, or other new moms.  Try to stay positive in how you think. Think about the things you are grateful for.  Do not spend a lot of time alone.  Only take medicine as directed by your caregiver.  Keep all your postpartum appointments.  Let your caregiver know if you have any concerns. SEEK MEDICAL CARE IF: You are  having a reaction or problems with your medicine. SEEK IMMEDIATE MEDICAL CARE IF:  You have suicidal feelings.  You feel you may harm the baby or someone else. Document Released: 05/12/2004 Document Revised: 10/31/2011 Document Reviewed: 05/20/2013 [facility] Patient Information 2014 [place], [place]. Breastfeeding, Twins or Multiples Mothers of twins or multiples might feel overwhelmed with the idea of breastfeeding more than one baby at a time. It is easier and less expensive to breastfeed twins than to bottle feed them. This is because you do not need to buy infant formula, wash bottles, buy mild soap, and fill the bottles for more than one baby when it is time to feed them. Human milk is especially important for twins, who are often small at birth  and need all the advantages breast milk can provide. Breastfeeding also helps create a unique and special bond between the mother and each of her infants.  Mothers of multiples get more benefits from breastfeeding:  Your uterus contracts and returns to its original size faster. This is helpful because it has stretched even more than normal to hold more than one baby.  Hormones are released that relax the mother. This is helpful with the added stress of caring for more than one infant.  The mother often finds she is saving herself time and money, because there is no need to prepare formula or bottles. Your milk is available whenever your babies are ready to feed, at the right temperature, providing optimal nutrition. If the babies are premature and unable to nurse, you can pump your breasts and freeze the milk until the babies are ready to feed at the breast. To stimulate a milk supply, your breasts need to be emptied at least 8 to 10 times in a 24 hour period. Ask a lactation specialist to help you choose an effective breast pump and to provide guidance in helping your babies latch onto, and feed from, the breast when they are ready. TO GET  STARTED: Nurse as soon as possible after birth, and as often as the babies want to do so. This will stimulate your breasts to produce adequate amounts of milk. Mothers of twins almost always produce enough milk for both babies.  TIPS TO INCREASE SUCCESS:  Many mothers of multiples find it easiest to nurse the babies together. However, if one of the infants is having difficulty latching or sucking, the mother may need to give that baby her full attention when it is time to feed.  Nursing two babies at the same time often gets easier as the babies get older and more experienced at latching onto the breast. Extra pillows under the mother's arms, legs, and under the babies can help this process.  Breastfeeding two babies at once may increase the mother's milk producing hormone (prolactin) levels, and boost her milk production. The more often the babies breastfeed effectively, the more milk the mother will produce.  Switch the babies from one side to the other at alternate feedings. For instance, if baby A feeds from the right breast and baby B feeds from the left breast, then at the next feeding, baby A should take the left breast and baby B the right breast. This ensures that both breasts get equal amounts of stimulation. It also uses the stronger sucking twin to increase the milk supply for the twin whose suck is weaker.  If one of the babies is having difficulty feeding, it may help to try breastfeeding him at the same time as his sibling. The baby with the stronger or more effective suck will stimulate the mother's milk to flow faster. This will encourage his twin to suck and swallow correctly.  It is important to avoid limiting the amount of time each baby spends feeding at the breast. This allows both babies to obtain the fattier milk that is available at the end of the feeding, when the breast is emptier.  Avoiding bottles and pacifiers during the early weeks will encourage effective sucking  patterns and help establish a good milk supply. You should not need supplements if you empty your breasts with each feeding.  A good latch for both infants is important in helping the babies empty the breast effectively, and for avoiding sore nipples. The most common cause  of soreness is improper latch-on and positioning.  In the early days, keep track of each infant's stools and wet diapers, to make sure each baby is getting enough milk. In the first 6 weeks, each baby should have 6 to 8 wet cloth diapers (5 to 6 disposable diapers) and 2 or more bowel movements per day.  There are several positions and holds that make it easier to nurse more than one baby at a time. Ask your nurse or lactation specialist to suggest tips on positioning.  If you know you are having twins, talk with a lactation consultant about more ways you can increase your success at breastfeeding. Document Released: 12/06/2004 Document Revised: 10/31/2011 Document Reviewed: 05/20/2013 [facility] Patient Information 2014 [place], [place].

## 2013-09-24 NOTE — Progress Notes (Addendum)
Patient ID: Savannah Arnold, female   DOB: [birth year], 28 y.o.   MRN: [id number] Subjective: POD# 3 Information for the patient's newborn:  Savannah Arnold, Savannah Arnold [[id number]][[id number]]  female Information for the patient's newborn:  Savannah Arnold, Savannah Arnold [[id number]][[id number]]  female   Reports feeling well, but itching is increasing in intensity on both legs and feet Feeding: breast and bottle Patient reports tolerating PO.  Breast symptoms: none Pain controlled with ibuprofen (OTC) and narcotic analgesics including Percocet Denies HA/SOB/C/P/N/V/dizziness. Flatus present. She reports vaginal bleeding as normal, without clots.  She is ambulating, urinating without difficult.     Objective:   VS:  Filed Vitals:   09/22/13 1808 09/23/13 0625 09/23/13 1800 09/24/13 0545  BP: 108/69 93/58 128/74 110/70  Pulse: 69 56 65 54  Temp: 98.1 F (36.7 C) 98.1 F (36.7 C) 98.2 F (36.8 C)   TempSrc: Oral Oral Oral   Resp: 18 18 18 18   Weight:      SpO2:    97%    No intake or output data in the 24 hours ending 09/24/13 0833      Recent Labs  09/22/13 0629  WBC 10.7*  HGB 9.2*  HCT 26.8*  PLT 123*     Blood type: --/--/O POS, O POS (01/30 1155)  Rubella: Immune (07/25 0000)     Physical Exam:  General: alert, cooperative and no distress Abdomen: soft, nontender, drying pupps rash Incision: Tegaderm and Honeycomb dressing intact, small amount of dried blood on Honeycomb - skin well apporximated with staples Uterine Fundus: firm, 1 FB below umbilicus, nontender Lochia: minimal Ext: extremities normal, atraumatic, no cyanosis or edema, Homans sign is negative, no sign of DVT and drying pupps rash on shins and tops of feet, bilaterally      Assessment/Plan: 28 y.o.   POD# 3.  s/p Cesarean Delivery.  Indications: twins and malpresentation of twin B                Postpartum care following cesarean delivery (1/31) IDA with compounding ABL Anemia  Doing well, stable.               Regular diet as  tolerated Ambulate Routine post-op care Resume Vistaril 25 mg every 6 hrs for itching Soak in [place] Oatmeal bath after incision healed ~2 wks Discharge this AM  *Dr. Ernestina PennaFogleman consulted on treatment of postpartum itching  Savannah Arnold, Savannah Arnold, M, MSN, CNM 09/24/2013, 8:33 AM

## 2013-09-26 ENCOUNTER — Other Ambulatory Visit (HOSPITAL_COMMUNITY): Payer: BC Managed Care – PPO

## 2014-01-24 ENCOUNTER — Encounter: Payer: Self-pay | Admitting: *Deleted

## 2014-06-23 ENCOUNTER — Encounter (HOSPITAL_COMMUNITY): Payer: Self-pay

## 2018-01-27 ENCOUNTER — Telehealth: Payer: BC Managed Care – PPO | Admitting: Family Medicine

## 2018-01-27 DIAGNOSIS — J019 Acute sinusitis, unspecified: Secondary | ICD-10-CM

## 2018-01-27 MED ORDER — AMOXICILLIN-POT CLAVULANATE 875-125 MG PO TABS: 1.0000 | ORAL_TABLET | Freq: Two times a day (BID) | ORAL | 0 refills | Status: DC

## 2018-01-27 MED ORDER — FLUCONAZOLE 150 MG PO TABS: 150.0000 mg | ORAL_TABLET | Freq: Once | ORAL | 0 refills | Status: AC

## 2018-01-27 MED ORDER — LEVOCETIRIZINE DIHYDROCHLORIDE 5 MG PO TABS: 5.0000 mg | ORAL_TABLET | Freq: Every evening | ORAL | 1 refills | Status: DC

## 2018-01-27 NOTE — Progress Notes (Signed)
We are sorry that you are not feeling well.  Here is how we plan to help!  Based on what you have shared with me it looks like you have sinusitis.  Sinusitis is inflammation and infection in the sinus cavities of the head.  Based on your presentation I believe you most likely have Acute Bacterial Sinusitis.  This is an infection caused by bacteria and is treated with antibiotics. I have prescribed Augmentin 875mg /125mg  one tablet twice daily with food, for 10 days and recommend adding Levocetirizine 5 mg for antihistamine therapy  . You may use an oral decongestant such as Mucinex D or if you have glaucoma or high blood pressure use plain Mucinex. Saline nasal spray help and can safely be used as often as needed for congestion.  If you develop worsening sinus pain, fever or notice severe headache and vision changes, or if symptoms are not better after completion of antibiotic, please schedule an appointment with a health care provider.    Sinus infections are not as easily transmitted as other respiratory infection, however we still recommend that you avoid close contact with loved ones, especially the very young and elderly.  Remember to wash your hands thoroughly throughout the day as this is the number one way to prevent the spread of infection!  Home Care: Only take medications as instructed by your medical team. Complete the entire course of an antibiotic. Do not take these medications with alcohol. A steam or ultrasonic humidifier can help congestion.  You can place a towel over your head and breathe in the steam from hot water coming from a faucet. Avoid close contacts especially the very young and the elderly. Cover your mouth when you cough or sneeze. Always remember to wash your hands.  Get Help Right Away If: You develop worsening fever or sinus pain. You develop a severe head ache or visual changes. Your symptoms persist after you have completed your treatment plan.  Make sure  you Understand these instructions. Will watch your condition. Will get help right away if you are not doing well or get worse.  Your e-visit answers were reviewed by a board certified advanced clinical practitioner to complete your personal care plan.  Depending on the condition, your plan could have included both over the counter or prescription medications.  If there is a problem please reply once you have received a response from your provider.  Your safety is important to us.  If you have drug allergies check your prescription carefully.    You can use MyChart to ask questions about today's visit, request a non-urgent call back, or ask for a work or school excuse for 24 hours related to this e-Visit. If it has been greater than 24 hours you will need to follow up with your provider, or enter a new e-Visit to address those concerns.  You will get an e-mail in the next two days asking about your experience.  I hope that your e-visit has been valuable and will speed your recovery. Thank you for using e-visits.  Godfrey PickKimberly S. Tiburcio PeaHarris, MSN, FNP-C [facility]

## 2018-08-16 ENCOUNTER — Telehealth: Payer: BC Managed Care – PPO | Admitting: Family

## 2018-08-16 ENCOUNTER — Encounter: Payer: Self-pay | Admitting: Family

## 2018-08-16 ENCOUNTER — Ambulatory Visit (HOSPITAL_COMMUNITY)
Admission: EM | Admit: 2018-08-16 | Discharge: 2018-08-16 | Disposition: A | Discharge status: Home / Self Care | Payer: BC Managed Care – PPO

## 2018-08-16 DIAGNOSIS — B9789 Other viral agents as the cause of diseases classified elsewhere: Secondary | ICD-10-CM

## 2018-08-16 DIAGNOSIS — J329 Chronic sinusitis, unspecified: Secondary | ICD-10-CM

## 2018-08-16 DIAGNOSIS — J208 Acute bronchitis due to other specified organisms: Secondary | ICD-10-CM

## 2018-08-16 MED ORDER — BENZONATATE 100 MG PO CAPS: 100.0000 mg | ORAL_CAPSULE | Freq: Three times a day (TID) | ORAL | 0 refills | Status: DC | PRN

## 2018-08-16 MED ORDER — ALBUTEROL SULFATE (2.5 MG/3ML) 0.083% IN NEBU: 2.5000 mg | INHALATION_SOLUTION | Freq: Four times a day (QID) | RESPIRATORY_TRACT | 1 refills | Status: DC | PRN

## 2018-08-16 MED ORDER — ALBUTEROL SULFATE HFA 108 (90 BASE) MCG/ACT IN AERS: 2.0000 | INHALATION_SPRAY | Freq: Four times a day (QID) | RESPIRATORY_TRACT | 3 refills | Status: AC | PRN

## 2018-08-16 MED ORDER — PREDNISONE 5 MG PO TABS: 5.0000 mg | ORAL_TABLET | ORAL | 0 refills | Status: DC

## 2018-08-16 NOTE — Progress Notes (Signed)
Thank you for the details you included in the comment boxes. Those details are very helpful in determining the best course of treatment for you and help us to provide the best care.See plan below. I also refilled your albuterol rescue inhaler and nebulizer solution.   Providers prescribe antibiotics to treat infections caused by bacteria. Antibiotics are very powerful in treating bacterial infections when they are used properly. To maintain their effectiveness, they should be used only when necessary. Overuse of antibiotics has resulted in the development of superbugs that are resistant to treatment.Typically, antibiotics may be considered after 7 days and nearly all sinus/lung infections are viruses.   After careful review of your answers, I would not recommend an antibiotic for your condition.  Antibiotics are not effective against viruses and therefore should not be used to treat them. Common examples of infections caused by viruses include colds and flu; in addition to sinus and lung infections (sinusitis/bronchitis).  We are sorry that you are not feeling well.  Here is how we plan to help!  Based on your presentation I believe you most likely have A cough due to a virus.  This is called viral bronchitis/sinusitis and is best treated by rest, plenty of fluids and control of the cough.  You may use Ibuprofen or Tylenol as directed to help your symptoms.     In addition you may use A non-prescription cough medication called Mucinex DM: take 2 tablets every 12 hours. and A prescription cough medication called Tessalon Perles 100mg . You may take 1-2 capsules every 8 hours as needed for your cough.  Prednisone 5 mg daily for 6 days (see taper instructions below)  Directions for 6 day taper: Day 1: 2 tablets before breakfast, 1 after both lunch & dinner and 2 at bedtime Day 2: 1 tab before breakfast, 1 after both lunch & dinner and 2 at bedtime Day 3: 1 tab at each meal & 1 at bedtime Day 4: 1 tab  at breakfast, 1 at lunch, 1 at bedtime Day 5: 1 tab at breakfast & 1 tab at bedtime Day 6: 1 tab at breakfast   From your responses in the eVisit questionnaire you describe inflammation in the upper respiratory tract which is causing a significant cough.  This is commonly called Bronchitis and has four common causes:    Allergies  Viral Infections  Acid Reflux  Bacterial Infection Allergies, viruses and acid reflux are treated by controlling symptoms or eliminating the cause. An example might be a cough caused by taking certain blood pressure medications. You stop the cough by changing the medication. Another example might be a cough caused by acid reflux. Controlling the reflux helps control the cough.  USE OF BRONCHODILATOR ("RESCUE") INHALERS: There is a risk from using your bronchodilator too frequently.  The risk is that over-reliance on a medication which only relaxes the muscles surrounding the breathing tubes can reduce the effectiveness of medications prescribed to reduce swelling and congestion of the tubes themselves.  Although you feel brief relief from the bronchodilator inhaler, your asthma may actually be worsening with the tubes becoming more swollen and filled with mucus.  This can delay other crucial treatments, such as oral steroid medications. If you need to use a bronchodilator inhaler daily, several times per day, you should discuss this with your provider.  There are probably better treatments that could be used to keep your asthma under control.     HOME CARE . Only take medications as instructed by  your medical team. . Complete the entire course of an antibiotic. . Drink plenty of fluids and get plenty of rest. . Avoid close contacts especially the very young and the elderly . Cover your mouth if you cough or cough into your sleeve. . Always remember to wash your hands . A steam or ultrasonic humidifier can help congestion.   GET HELP RIGHT AWAY IF: . You develop  worsening fever. . You become short of breath . You cough up blood. . Your symptoms persist after you have completed your treatment plan MAKE SURE YOU   Understand these instructions.  Will watch your condition.  Will get help right away if you are not doing well or get worse.  Your e-visit answers were reviewed by a board certified advanced clinical practitioner to complete your personal care plan.  Depending on the condition, your plan could have included both over the counter or prescription medications. If there is a problem please reply  once you have received a response from your provider. Your safety is important to us.  If you have drug allergies check your prescription carefully.    You can use MyChart to ask questions about today's visit, request a non-urgent call back, or ask for a work or school excuse for 24 hours related to this e-Visit. If it has been greater than 24 hours you will need to follow up with your provider, or enter a new e-Visit to address those concerns. You will get an e-mail in the next two days asking about your experience.  I hope that your e-visit has been valuable and will speed your recovery. Thank you for using e-visits.

## 2018-09-27 ENCOUNTER — Other Ambulatory Visit: Payer: Self-pay | Admitting: Obstetrics & Gynecology

## 2018-09-27 DIAGNOSIS — Z1231 Encounter for screening mammogram for malignant neoplasm of breast: Secondary | ICD-10-CM

## 2018-10-17 ENCOUNTER — Ambulatory Visit: Payer: BC Managed Care – PPO | Admitting: Obstetrics & Gynecology

## 2018-10-17 ENCOUNTER — Encounter: Payer: Self-pay | Admitting: Obstetrics & Gynecology

## 2018-10-17 VITALS — BP 120/62 | Ht 63.0 in | Wt 139.2 lb

## 2018-10-17 DIAGNOSIS — Z30431 Encounter for routine checking of intrauterine contraceptive device: Secondary | ICD-10-CM | POA: Diagnosis not present

## 2018-10-17 DIAGNOSIS — Z01419 Encounter for gynecological examination (general) (routine) without abnormal findings: Secondary | ICD-10-CM

## 2018-10-17 DIAGNOSIS — Z803 Family history of malignant neoplasm of breast: Secondary | ICD-10-CM

## 2018-10-17 NOTE — Progress Notes (Signed)
Savannah Arnold 13-Nov-1985 [id number]   History:    33 y.o. G2P2L3 Married.  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  40 yo son and twins 61 yo daughters.  RP:  Established patient presenting for annual gyn exam   HPI: Well on Mirena IUD x 10/2014.  Last visit, good IUD placement was confirmed by [place] as the strings were not visible.  No pelvic pain.  No pain with IC.  Urine/BMs normal.  Breasts normal.  Screening Mammo scheduled next week.  Mother recently Dxed with Breast Cancer, had bilateral Mastectomy given a strong Fam H/O Breast Ca with a total of 10 cases in her family.  Mother's genetic testing negative 07/2018.  Other types of Cancers also present on Maternal side.  Past medical history,surgical history, family history and social history were all reviewed and documented in the EPIC chart.  Gynecologic History No LMP recorded. (Menstrual status: IUD). Contraception: Mirena IUD x 10/2014  Last Pap: 08/2016. Results were: ASCUS/HPV HR negative Last mammogram: Scheduled for next week Bone Density: Never Colonoscopy: Never  Obstetric History OB History  Gravida Para Term Preterm AB Living  2 2 2     3   SAB TAB Ectopic Multiple Live Births        1 3    # Outcome Date GA Lbr Len/2nd Weight Sex Delivery Anes PTL Lv  2A Term 09/21/13 [redacted]w[redacted]d   F CS-LTranv Spinal  LIV  2B Term 09/21/13 [redacted]w[redacted]d   F CS-LTranv Spinal  LIV  1 Term 2011     Vag-Spont   LIV     ROS: A ROS was performed and pertinent positives and negatives are included in the history.  GENERAL: No fevers or chills. HEENT: No change in vision, no earache, sore throat or sinus congestion. NECK: No pain or stiffness. CARDIOVASCULAR: No chest pain or pressure. No palpitations. PULMONARY: No shortness of breath, cough or wheeze. GASTROINTESTINAL: No abdominal pain, nausea, vomiting or diarrhea, melena or bright red blood per rectum. GENITOURINARY: No urinary frequency, urgency, hesitancy or dysuria. MUSCULOSKELETAL: No joint or muscle pain, no  back pain, no recent trauma. DERMATOLOGIC: No rash, no itching, no lesions. ENDOCRINE: No polyuria, polydipsia, no heat or cold intolerance. No recent change in weight. HEMATOLOGICAL: No anemia or easy bruising or bleeding. NEUROLOGIC: No headache, seizures, numbness, tingling or weakness. PSYCHIATRIC: No depression, no loss of interest in normal activity or change in sleep pattern.     Exam:   BP 120/62   Ht 5\' 3"  (1.6 m)   Wt 139 lb 3.2 oz (63.1 kg)   BMI 24.66 kg/m   Body mass index is 24.66 kg/m.  General appearance : Well developed well nourished female. No acute distress HEENT: Eyes: no retinal hemorrhage or exudates,  Neck supple, trachea midline, no carotid bruits, no thyroidmegaly Lungs: Clear to auscultation, no rhonchi or wheezes, or rib retractions  Heart: Regular rate and rhythm, no murmurs or gallops Breast:Examined in sitting and supine position were symmetrical in appearance, no palpable masses or tenderness,  no skin retraction, no nipple inversion, no nipple discharge, no skin discoloration, no axillary or supraclavicular lymphadenopathy Abdomen: no palpable masses or tenderness, no rebound or guarding Extremities: no edema or skin discoloration or tenderness  Pelvic: Vulva: Normal             Vagina: No gross lesions or discharge  Cervix: No gross lesions or discharge.  Pap reflex done.  IUD strings felt in the lower Endocervical canal.  Uterus  AV,  normal size, shape and consistency, non-tender and mobile  Adnexa  Without masses or tenderness  Anus: Normal   Assessment/Plan:  33 y.o. female for annual exam   1. Encounter for annual routine gynecological examination Normal gynecologic exam.  Pap reflex done.  Breast exam normal.  Has a screening mammogram scheduled next week because of a strong family history of breast cancer including her mother diagnosed recently.  Good body mass index at 24.66.  Continue with healthy nutrition and fitness. - Pap IG w/ reflex  to HPV when ASC-U  2. Encounter for routine checking of intrauterine contraceptive device (IUD) Mirena IUD inserted in March 2016, well-tolerated and in good location.  3. Family history of breast cancer in mother Strong family history of breast cancer with 10 maternal relatives affected.  Mother recently diagnosed with breast cancer, genetic screening negative in December 2019.  Nonetheless, decision to refer patient for genetic counseling at [facility].  Patient agrees.  Screening mammogram scheduled for next week.  Other orders - amphetamine-dextroamphetamine (ADDERALL XR) 25 MG 24 hr capsule; Take by mouth. - levonorgestrel (MIRENA) 20 MCG/24HR IUD; by Intrauterine route. - DULoxetine (CYMBALTA) 30 MG capsule; Take 30 mg by mouth daily.  Genia Del MD, 4:38 PM 10/17/2018

## 2018-10-18 ENCOUNTER — Encounter: Payer: Self-pay | Admitting: Obstetrics & Gynecology

## 2018-10-18 ENCOUNTER — Telehealth: Payer: Self-pay | Admitting: *Deleted

## 2018-10-18 DIAGNOSIS — Z803 Family history of malignant neoplasm of breast: Secondary | ICD-10-CM

## 2018-10-18 NOTE — Telephone Encounter (Signed)
Referral placed in epic at [facility] they will call to schedule.

## 2018-10-18 NOTE — Telephone Encounter (Signed)
-----   Message from Genia Del, MD sent at 10/17/2018  4:58 PM EST ----- Regarding: [facility] referral Strong Family h/o Breast Cancer.  Mother recently Dxed with Breast Cancer.  Total of 10 maternal family members with Breast Cancer.  Other types of Cancers in Family.

## 2018-10-18 NOTE — Patient Instructions (Signed)
1. Encounter for annual routine gynecological examination Normal gynecologic exam.  Pap reflex done.  Breast exam normal.  Has a screening mammogram scheduled next week because of a strong family history of breast cancer including her mother diagnosed recently.  Good body mass index at 24.66.  Continue with healthy nutrition and fitness. - Pap IG w/ reflex to HPV when ASC-U  2. Encounter for routine checking of intrauterine contraceptive device (IUD) Mirena IUD inserted in March 2016, well-tolerated and in good location.  3. Family history of breast cancer in mother Strong family history of breast cancer with 10 maternal relatives affected.  Mother recently diagnosed with breast cancer, genetic screening negative in December 2019.  Nonetheless, decision to refer patient for genetic counseling at [facility].  Patient agrees.  Screening mammogram scheduled for next week.  Other orders - amphetamine-dextroamphetamine (ADDERALL XR) 25 MG 24 hr capsule; Take by mouth. - levonorgestrel (MIRENA) 20 MCG/24HR IUD; by Intrauterine route. - DULoxetine (CYMBALTA) 30 MG capsule; Take 30 mg by mouth daily.  Savannah Arnold, it was a pleasure seeing you today!  I will inform you of your results as soon as they are available.

## 2018-10-22 ENCOUNTER — Encounter: Payer: Self-pay | Admitting: *Deleted

## 2018-10-22 LAB — PAP IG W/ RFLX HPV ASCU

## 2018-10-22 LAB — HUMAN PAPILLOMAVIRUS, HIGH RISK: HPV DNA HIGH RISK: NOT DETECTED

## 2018-10-25 ENCOUNTER — Ambulatory Visit
Admission: RE | Admit: 2018-10-25 | Discharge: 2018-10-25 | Disposition: A | Discharge status: Home / Self Care | Payer: BC Managed Care – PPO | Source: Ambulatory Visit | Attending: Obstetrics & Gynecology | Admitting: Obstetrics & Gynecology

## 2018-10-25 DIAGNOSIS — Z1231 Encounter for screening mammogram for malignant neoplasm of breast: Secondary | ICD-10-CM

## 2018-11-01 NOTE — Telephone Encounter (Signed)
x2 message have been left for patient to schedule, by scheduler. I will close on my end as patient is aware to call

## 2018-12-13 ENCOUNTER — Ambulatory Visit: Payer: BC Managed Care – PPO | Admitting: Obstetrics & Gynecology

## 2019-02-07 ENCOUNTER — Encounter: Payer: Self-pay | Admitting: Obstetrics & Gynecology

## 2019-02-07 ENCOUNTER — Ambulatory Visit (INDEPENDENT_AMBULATORY_CARE_PROVIDER_SITE_OTHER): Payer: BC Managed Care – PPO | Admitting: Obstetrics & Gynecology

## 2019-02-07 ENCOUNTER — Other Ambulatory Visit: Payer: Self-pay

## 2019-02-07 VITALS — BP 128/84

## 2019-02-07 DIAGNOSIS — R87610 Atypical squamous cells of undetermined significance on cytologic smear of cervix (ASC-US): Secondary | ICD-10-CM

## 2019-02-07 NOTE — Patient Instructions (Signed)
1. ASCUS of cervix with negative high risk HPV ASCUS x 2 with HPV HR negative each time.  Significance of ASCUS discussed.  Possibility of low risk HPV.  Colposcopy procedure explained. Colposcopy findings reviewed with patient.  Procedure well tolerated, no complication.  Pending cervical biopsies.  Management per results.  Other orders - Pathology Report ([facility])  Danae Chen, it was a pleasure seeing you today!  I will inform you of your results as soon as they are available.

## 2019-02-07 NOTE — Progress Notes (Signed)
    Savannah Arnold 1986/05/06 [id number]        33 y.o.  G2P2L3  Married  RP: ASCUS x 2  HPI: ASCUS/HPV HR neg in 08/2016 and 09/2018.  Well with Mirena IUD.   OB History  Gravida Para Term Preterm AB Living  2 2 2     3   SAB TAB Ectopic Multiple Live Births        1 3    # Outcome Date GA Lbr Len/2nd Weight Sex Delivery Anes PTL Lv  2A Term 09/21/13 [redacted]w[redacted]d   F CS-LTranv Spinal  LIV  2B Term 09/21/13 [redacted]w[redacted]d   F CS-LTranv Spinal  LIV  1 Term 2011     Vag-Spont   LIV    Past medical history,surgical history, problem list, medications, allergies, family history and social history were all reviewed and documented in the EPIC chart.   Directed ROS with pertinent positives and negatives documented in the history of present illness/assessment and plan.  Exam:  Vitals:   02/07/19 1428  BP: 128/84   General appearance:  Normal  Colposcopy Procedure Note Savannah Arnold 02/07/2019  Indications:  ASCUS x 2, HPV HR negative  Procedure Details  The risks and benefits of the procedure and Verbal informed consent obtained.  Speculum placed in vagina and excellent visualization of cervix achieved, cervix swabbed x 3 with acetic acid solution.  Findings:  Cervix colposcopy: Physical Exam Genitourinary:       Vaginal colposcopy: Normal  Vulvar colposcopy: Normal  Perirectal colposcopy: Grossly normal  The cervix was sprayed with Hurricane before performing the cervical biopsies.  Specimens: Cervical Bxs at 1 and 5 O'clock.  Complications:  None, hemostasis with Silver Nitrate . Plan:  Management per results   Assessment/Plan:  33 y.o. G2P2003   1. ASCUS of cervix with negative high risk HPV ASCUS x 2 with HPV HR negative each time.  Significance of ASCUS discussed.  Possibility of low risk HPV.  Colposcopy procedure explained. Colposcopy findings reviewed with patient.  Procedure well tolerated, no complication.  Pending cervical biopsies.  Management per results.   Other orders - Pathology Report ([facility])  Princess Bruins MD, 2:38 PM 02/07/2019

## 2019-02-11 LAB — TISSUE PATH REPORT

## 2019-02-11 LAB — PATHOLOGY REPORT

## 2019-02-12 ENCOUNTER — Other Ambulatory Visit: Payer: Self-pay | Admitting: Obstetrics & Gynecology

## 2019-02-12 MED ORDER — AMOXICILLIN 500 MG PO CAPS: 500.0000 mg | ORAL_CAPSULE | Freq: Three times a day (TID) | ORAL | 0 refills | Status: DC

## 2019-02-12 NOTE — Progress Notes (Unsigned)
I received an allergy warning when I went to prescribe the Amoxicillin. She is allergic (anaphalaxis) to Ceftin.  You can click the unsigned order to see the warning box. Please advise.  I called patient to let her know about this and that Rx may be delayed going in this evening and I will call her when I send it. She said that she has taking Amoxicillin "plenty of times" with no problems.

## 2020-01-25 IMAGING — MG DIGITAL SCREENING BILATERAL MAMMOGRAM WITH TOMO AND CAD
8 series · 9 of 24 positions shown · non-contrast
Comparison: None.

CLINICAL DATA: Screening. Baseline.

EXAM:
DIGITAL SCREENING BILATERAL MAMMOGRAM WITH TOMO AND CAD

[L CC synth-2D]
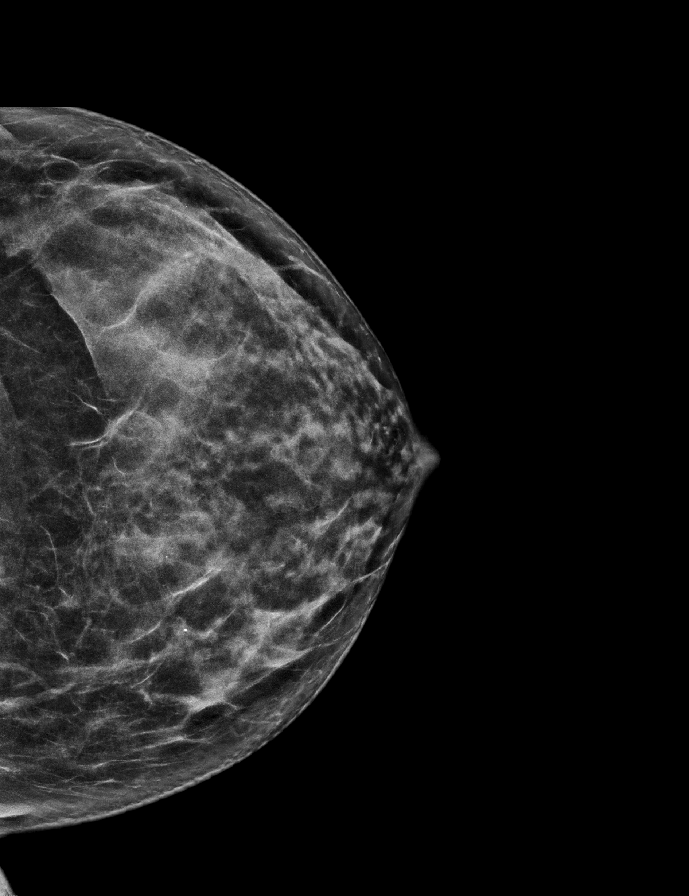

[L MLO synth-2D]
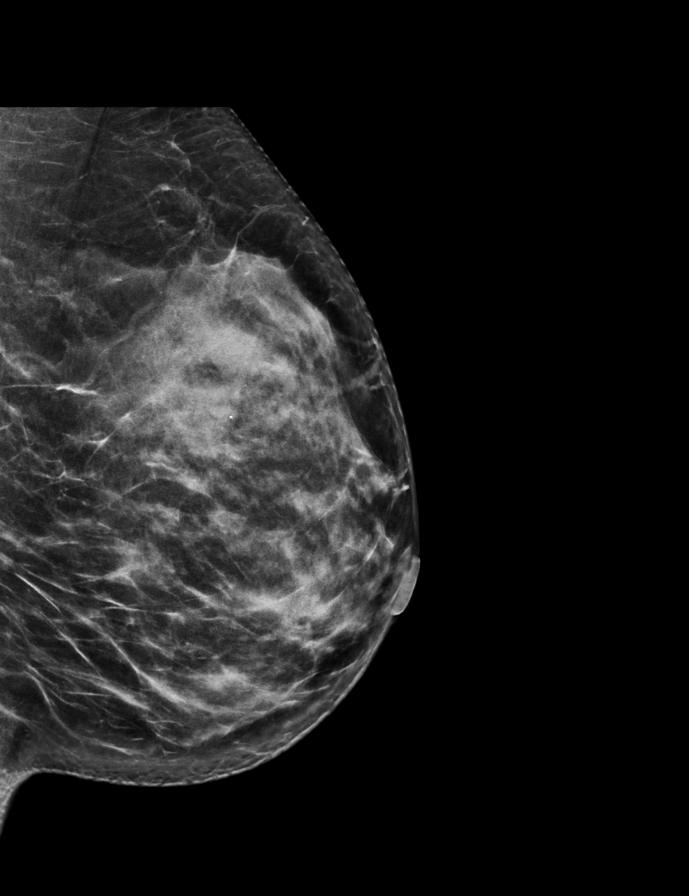

[R MLO synth-2D]
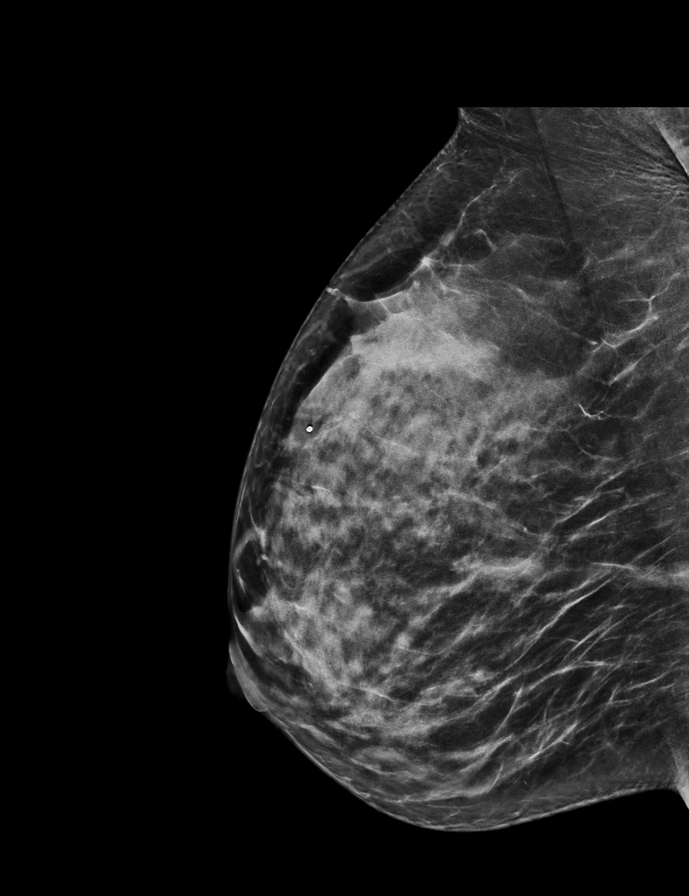

[R CC synth-2D]
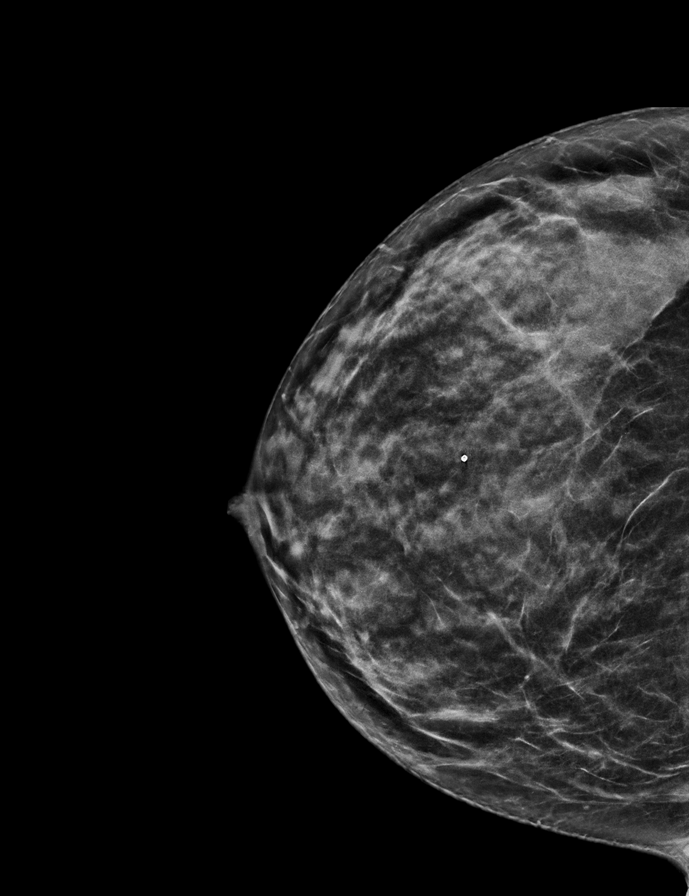

[R CC tomo · 2 of 54 frames shown]
[frame 18/54]
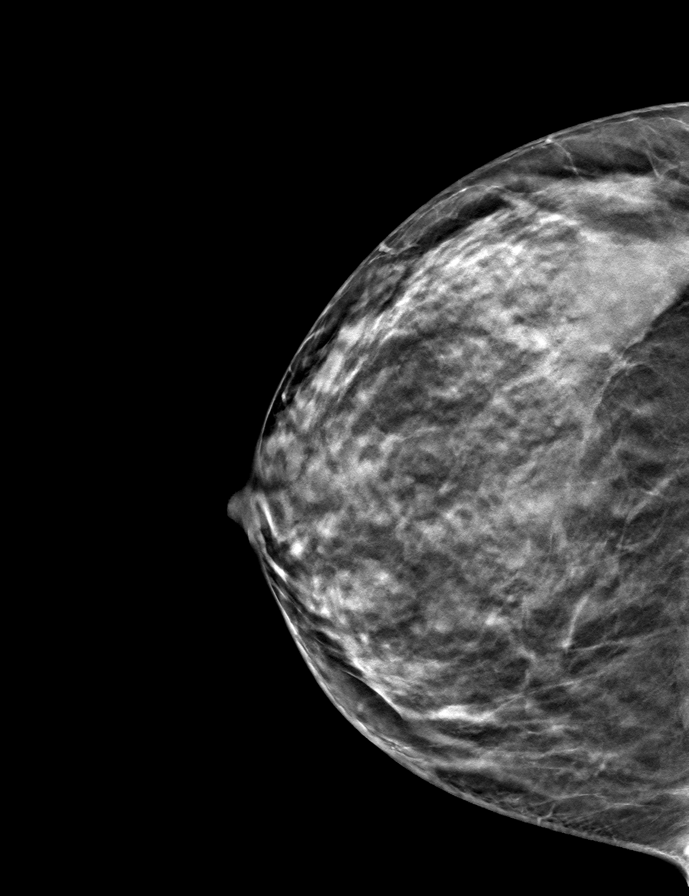
[frame 27/54]
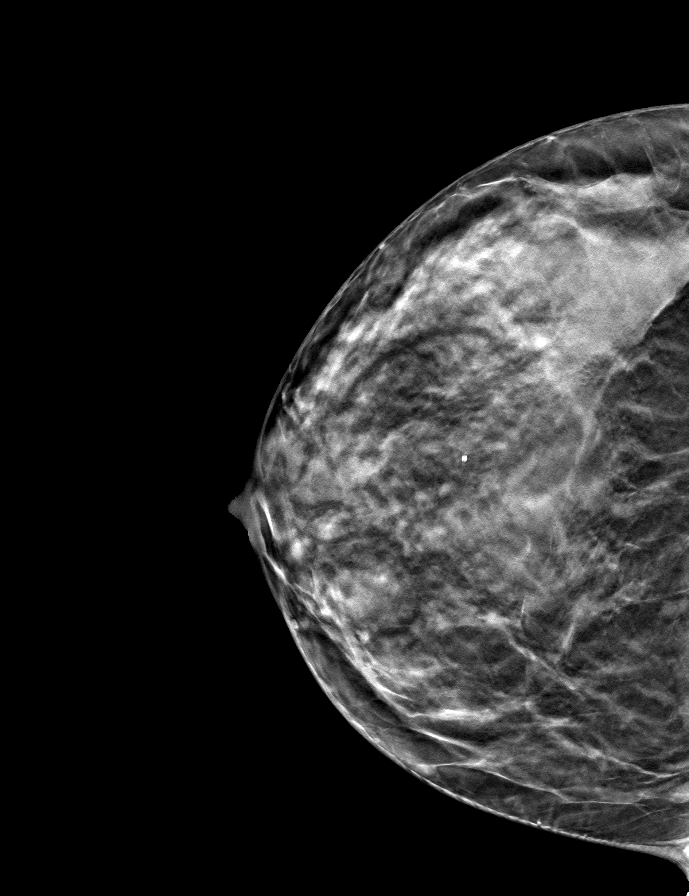

[L MLO tomo · tomo slice 31/60.0]
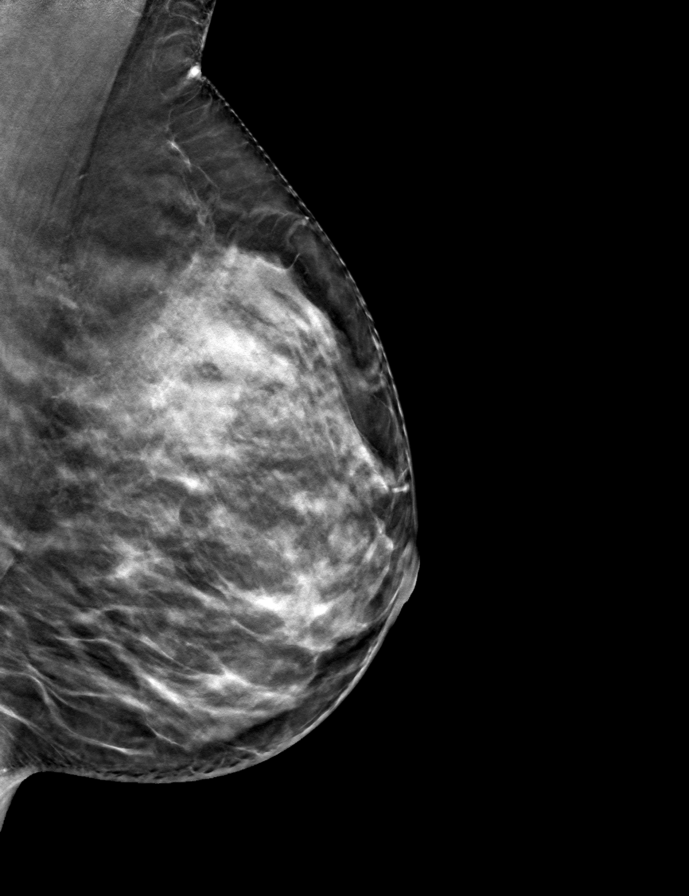

[R MLO tomo · tomo slice 31/61.0]
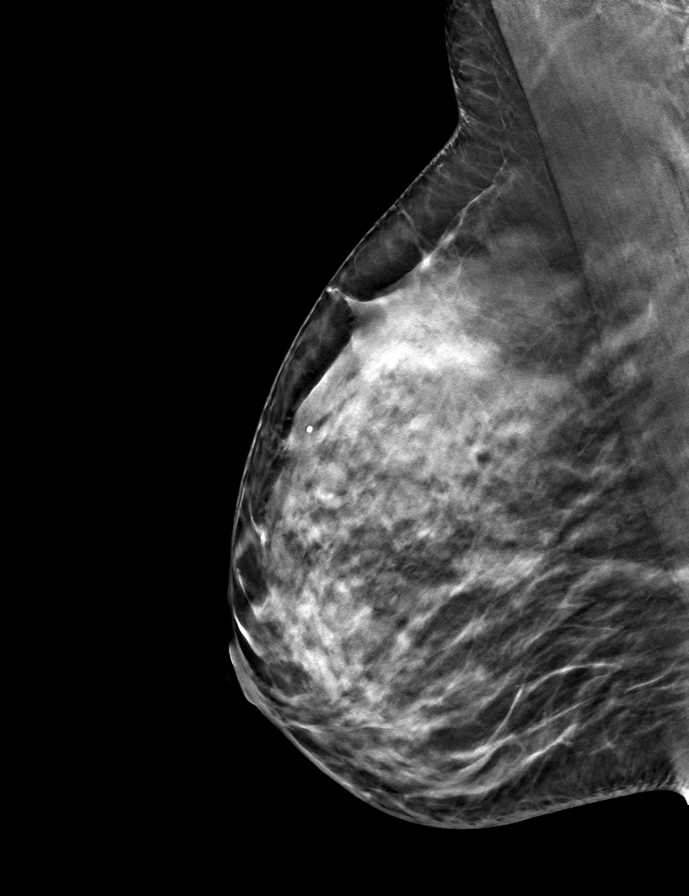

[L CC tomo · tomo slice 29/58.0]
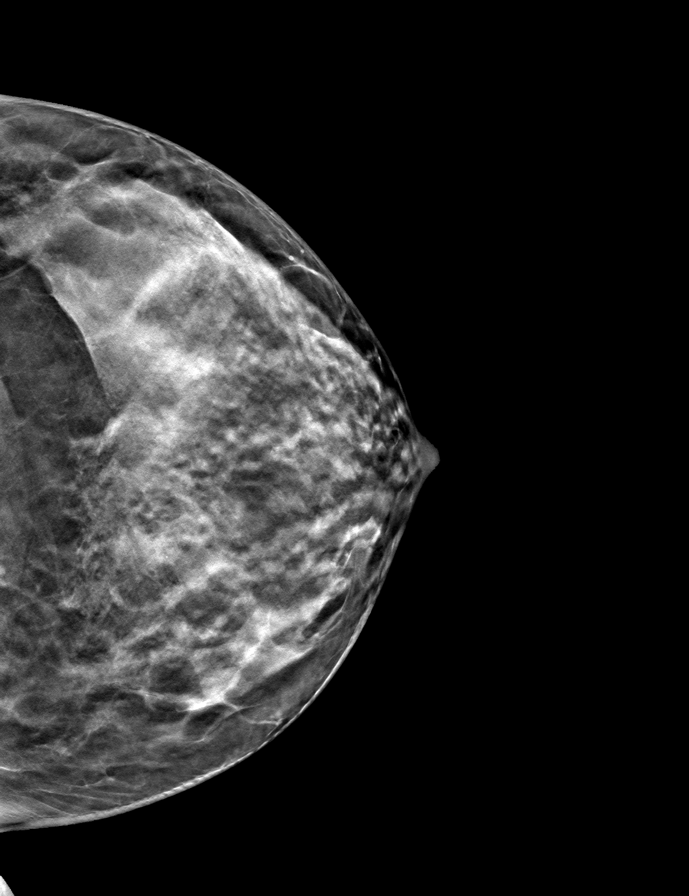

[9 of 24 positions shown; findings below may reference images not displayed]

ACR Breast Density Category d: The breast tissue is extremely dense,
which lowers the sensitivity of mammography.
FINDINGS: There are no findings suspicious for malignancy. Images were
processed with CAD.
IMPRESSION: No mammographic evidence of malignancy. A result letter of this
screening mammogram will be mailed directly to the patient.

RECOMMENDATION:
Screening mammogram at age 38. (Code:UB-4-OAS)

BI-RADS CATEGORY  1: Negative.

## 2020-02-21 ENCOUNTER — Encounter: Payer: BC Managed Care – PPO | Admitting: Obstetrics & Gynecology

## 2020-02-26 ENCOUNTER — Ambulatory Visit (INDEPENDENT_AMBULATORY_CARE_PROVIDER_SITE_OTHER): Payer: BC Managed Care – PPO | Admitting: Obstetrics & Gynecology

## 2020-02-26 ENCOUNTER — Other Ambulatory Visit: Payer: Self-pay

## 2020-02-26 ENCOUNTER — Encounter: Payer: Self-pay | Admitting: Obstetrics & Gynecology

## 2020-02-26 VITALS — BP 122/80 | Ht 63.0 in | Wt 137.0 lb

## 2020-02-26 DIAGNOSIS — Z30431 Encounter for routine checking of intrauterine contraceptive device: Secondary | ICD-10-CM | POA: Diagnosis not present

## 2020-02-26 DIAGNOSIS — Z113 Encounter for screening for infections with a predominantly sexual mode of transmission: Secondary | ICD-10-CM

## 2020-02-26 DIAGNOSIS — Z01419 Encounter for gynecological examination (general) (routine) without abnormal findings: Secondary | ICD-10-CM | POA: Diagnosis not present

## 2020-02-26 DIAGNOSIS — Z803 Family history of malignant neoplasm of breast: Secondary | ICD-10-CM

## 2020-02-26 DIAGNOSIS — Z1151 Encounter for screening for human papillomavirus (HPV): Secondary | ICD-10-CM

## 2020-02-26 NOTE — Progress Notes (Signed)
Savannah Arnold [birth year] [id number]   History:    34 y.o. G2P2L3 Married/separated.  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  47 yo son and twins 57 yo daughters.  RP:  Established patient presenting for annual gyn exam   HPI: Well on Mirena IUD x 10/2014, will f/u asap to change IUD.  Abstinent currently, separated.  Good IUD placement was confirmed by [place] as the strings were not visible in 2019.  No pelvic pain.  Urine/BMs normal.  Breasts normal. Screening Mammo Neg 10/2018.  Mother recently Dxed with Breast Cancer, had bilateral Mastectomy given a strong Fam H/O Breast Ca with a total of 10 cases in her family.  Mother's genetic testing negative 07/2018.  Other types of Cancers also present on Maternal side.   Past medical history,surgical history, family history and social history were all reviewed and documented in the EPIC chart.  Gynecologic History No LMP recorded (lmp unknown). (Menstrual status: IUD).  Obstetric History OB History  Gravida Para Term Preterm AB Living  2 2 2     3   SAB TAB Ectopic Multiple Live Births        1 3    # Outcome Date GA Lbr Len/2nd Weight Sex Delivery Anes PTL Lv  2A Term 09/21/13 [redacted]w[redacted]d   F CS-LTranv Spinal  LIV  2B Term 09/21/13 [redacted]w[redacted]d   F CS-LTranv Spinal  LIV  1 Term 2011     Vag-Spont   LIV     ROS: A ROS was performed and pertinent positives and negatives are included in the history.  GENERAL: No fevers or chills. HEENT: No change in vision, no earache, sore throat or sinus congestion. NECK: No pain or stiffness. CARDIOVASCULAR: No chest pain or pressure. No palpitations. PULMONARY: No shortness of breath, cough or wheeze. GASTROINTESTINAL: No abdominal pain, nausea, vomiting or diarrhea, melena or bright red blood per rectum. GENITOURINARY: No urinary frequency, urgency, hesitancy or dysuria. MUSCULOSKELETAL: No joint or muscle pain, no back pain, no recent trauma. DERMATOLOGIC: No rash, no itching, no lesions. ENDOCRINE: No polyuria, polydipsia, no  heat or cold intolerance. No recent change in weight. HEMATOLOGICAL: No anemia or easy bruising or bleeding. NEUROLOGIC: No headache, seizures, numbness, tingling or weakness. PSYCHIATRIC: No depression, no loss of interest in normal activity or change in sleep pattern.     Exam:   BP 122/80 (BP Location: Right Arm, Patient Position: Sitting, Cuff Size: Normal)   Ht 5\' 3"  (1.6 m)   Wt 137 lb (62.1 kg)   LMP  (LMP Unknown)   Breastfeeding No Comment: spotting  BMI 24.27 kg/m   Body mass index is 24.27 kg/m.  General appearance : Well developed well nourished female. No acute distress HEENT: Eyes: no retinal hemorrhage or exudates,  Neck supple, trachea midline, no carotid bruits, no thyroidmegaly Lungs: Clear to auscultation, no rhonchi or wheezes, or rib retractions  Heart: Regular rate and rhythm, no murmurs or gallops Breast:Examined in sitting and supine position were symmetrical in appearance, no palpable masses or tenderness,  no skin retraction, no nipple inversion, no nipple discharge, no skin discoloration, no axillary or supraclavicular lymphadenopathy Abdomen: no palpable masses or tenderness, no rebound or guarding Extremities: no edema or skin discoloration or tenderness  Pelvic: Vulva: Normal             Vagina: No gross lesions or discharge  Cervix: No gross lesions or discharge.  Pap/HPV HR, Gono-Chlam done.  Uterus  AV, normal size, shape and consistency, non-tender and mobile  Adnexa  Without masses or tenderness  Anus: Normal   Assessment/Plan:  34 y.o. female for annual exam   1. Encounter for routine gynecological examination with Papanicolaou smear of cervix Normal gynecologic exam.  Pap test with high-risk HPV done today.  Breast exam normal.  Screening mammogram March 2020 was negative, recommended to repeat at age 65.  Good body mass index at 24.27.  Fitness and healthy nutrition.  2. Encounter for routine checking of intrauterine contraceptive device  (IUD) Overdue for Mirena IUD change.  Abstinent currently.  F/U Mirena IUD removal/insertion asap.  3. Screen for STD (sexually transmitted disease) Condoms recommended. - Gono-Chlam on Pap - HIV antibody (with reflex) - RPR - Hepatitis C Antibody - Hepatitis B Surface AntiGEN  4. Family history of breast cancer in mother Mother with Breast Ca, genetic screening negative.  Other orders - busPIRone (BUSPAR) 5 MG tablet; Take 1 tablet by mouth daily.  Genia Del MD, 2:50 PM 02/26/2020

## 2020-02-26 NOTE — Addendum Note (Signed)
Addended by: Richardson Chiquito on: 02/26/2020 04:05 PM   Modules accepted: Orders

## 2020-02-26 NOTE — Patient Instructions (Signed)
1. Encounter for routine gynecological examination with Papanicolaou smear of cervix Normal gynecologic exam.  Pap test with high-risk HPV done today.  Breast exam normal.  Screening mammogram March 2020 was negative, recommended to repeat at age 34.  Good body mass index at 24.27.  Fitness and healthy nutrition.  2. Encounter for routine checking of intrauterine contraceptive device (IUD) Overdue for Mirena IUD change.  Abstinent currently.  F/U Mirena IUD removal/insertion asap.  3. Screen for STD (sexually transmitted disease) Condoms recommended. - Gono-Chlam on Pap - HIV antibody (with reflex) - RPR - Hepatitis C Antibody - Hepatitis B Surface AntiGEN  4. Family history of breast cancer in mother Mother with Breast Ca, genetic screening negative.  Other orders - busPIRone (BUSPAR) 5 MG tablet; Take 1 tablet by mouth daily.  Savannah Arnold, it was a pleasure seeing you today!  I will inform you of your results as soon as they are available.

## 2020-02-28 LAB — RPR: RPR Ser Ql: REACTIVE — AB

## 2020-02-28 LAB — HEPATITIS B SURFACE ANTIGEN: Hepatitis B Surface Ag: NONREACTIVE

## 2020-02-28 LAB — RPR TITER: RPR Titer: 1:2 {titer} — ABNORMAL HIGH

## 2020-02-28 LAB — FLUORESCENT TREPONEMAL AB(FTA)-IGG-BLD: Fluorescent Treponemal ABS: NONREACTIVE

## 2020-02-28 LAB — HEPATITIS C ANTIBODY
Hepatitis C Ab: NONREACTIVE
SIGNAL TO CUT-OFF: 0.04 (ref <1.00)

## 2020-02-28 LAB — HIV ANTIBODY (ROUTINE TESTING W REFLEX): HIV 1&2 Ab, 4th Generation: NONREACTIVE

## 2020-03-02 ENCOUNTER — Other Ambulatory Visit: Payer: Self-pay

## 2020-03-02 ENCOUNTER — Encounter: Payer: Self-pay | Admitting: Obstetrics & Gynecology

## 2020-03-02 ENCOUNTER — Ambulatory Visit (INDEPENDENT_AMBULATORY_CARE_PROVIDER_SITE_OTHER): Payer: BC Managed Care – PPO | Admitting: Obstetrics & Gynecology

## 2020-03-02 ENCOUNTER — Ambulatory Visit: Payer: BC Managed Care – PPO | Admitting: Obstetrics & Gynecology

## 2020-03-02 VITALS — BP 122/80

## 2020-03-02 DIAGNOSIS — Z30433 Encounter for removal and reinsertion of intrauterine contraceptive device: Secondary | ICD-10-CM

## 2020-03-02 DIAGNOSIS — R768 Other specified abnormal immunological findings in serum: Secondary | ICD-10-CM | POA: Diagnosis not present

## 2020-03-02 LAB — PAP IG, CT-NG NAA, HPV HIGH-RISK
C. trachomatis RNA, TMA: NOT DETECTED
HPV DNA High Risk: NOT DETECTED
N. gonorrhoeae RNA, TMA: NOT DETECTED

## 2020-03-02 NOTE — Progress Notes (Signed)
    Savannah Arnold 08/05/1986 [id number]        34 y.o.  G2P2003 Separated  RP: Removal/Insertion of Mirena IUD  HPI: Time to change Mirena IUD after 5 yrs.  Abstinent currently.  STI work-up negative with false pos RPR, fluorescent Treponema Negative.     OB History  Gravida Para Term Preterm AB Living  2 2 2     3   SAB TAB Ectopic Multiple Live Births        1 3    # Outcome Date GA Lbr Len/2nd Weight Sex Delivery Anes PTL Lv  2A Term 09/21/13 [redacted]w[redacted]d   F CS-LTranv Spinal  LIV  2B Term 09/21/13 [redacted]w[redacted]d   F CS-LTranv Spinal  LIV  1 Term 2011     Vag-Spont   LIV    Past medical history,surgical history, problem list, medications, allergies, family history and social history were all reviewed and documented in the EPIC chart.   Directed ROS with pertinent positives and negatives documented in the history of present illness/assessment and plan.  Exam:  Vitals:   03/02/20 0912  BP: 122/80   General appearance:  Normal                                                                    IUD procedure note       Patient presented to the office today for removal and placement of a Mirena IUD. The patient had previously been provided with literature information on this method of contraception. The risks benefits and pros and cons were discussed and all her questions were answered. She is fully aware that this form of contraception is 99% effective and is good for 5 years.  Pelvic exam: Vulva normal Vagina: No lesions or discharge Cervix: No lesions or discharge Uterus: Intermediate position.  Easy removal of IUD.  Well tolerated.  Intact/complete. Adnexa: No masses or tenderness Rectal exam: Not done  The cervix was cleansed with Betadine solution. Hurricane spray on the cervix.  A single-tooth tenaculum was placed on the anterior cervical lip. The IUD was shown to the patient and inserted in a sterile fashion.  Hysterometry with the IUD as being inserted was 7 cm.  The IUD string was  trimmed. The single-tooth tenaculum was removed. Patient was instructed to return back to the office in one month for follow up.        Assessment/Plan:  34 y.o. G2P2003   1. Encounter for IUD removal and reinsertion Time to change Mirena IUD.  STI screen February 26, 2020 was negative except for a false reactive RPR and non-reactive fluorescent Treponema.  Patient reassured.  Easy removal of Mirena IUD which was intact and complete.  A new Mirena IUD was inserted easily with no complication.  Well-tolerated by patient.  Postprocedure precautions reviewed.  Follow-up in 4 weeks for IUD check.  2. Biological false positive RPR test RPR reactive, but fluorescent treponema nonreactive.  Patient reassured.  February 28, 2020 MD, 9:23 AM 03/02/2020

## 2020-03-11 ENCOUNTER — Encounter: Payer: Self-pay | Admitting: Anesthesiology

## 2020-03-31 ENCOUNTER — Encounter: Payer: BC Managed Care – PPO | Admitting: Obstetrics & Gynecology

## 2020-03-31 DIAGNOSIS — Z0289 Encounter for other administrative examinations: Secondary | ICD-10-CM

## 2020-08-31 ENCOUNTER — Other Ambulatory Visit: Payer: Self-pay

## 2020-08-31 ENCOUNTER — Ambulatory Visit (INDEPENDENT_AMBULATORY_CARE_PROVIDER_SITE_OTHER): Payer: BC Managed Care – PPO | Admitting: Obstetrics & Gynecology

## 2020-08-31 ENCOUNTER — Encounter: Payer: Self-pay | Admitting: Obstetrics & Gynecology

## 2020-08-31 VITALS — BP 124/78

## 2020-08-31 DIAGNOSIS — Z30431 Encounter for routine checking of intrauterine contraceptive device: Secondary | ICD-10-CM

## 2020-08-31 NOTE — Progress Notes (Signed)
    Savannah Arnold Aug 03, 1986 [id number]        35 y.o.  G2P2003   RP: Mirena IUD check after insertion 03/02/2020  HPI: Had Covid in 03/2020, fully recovered now, but asthma still not as well controled as before.  No pelvic pain.  No vaginal discharge.  Menses are light.  No fever.   OB History  Gravida Para Term Preterm AB Living  2 2 2     3   SAB IAB Ectopic Multiple Live Births        1 3    # Outcome Date GA Lbr Len/2nd Weight Sex Delivery Anes PTL Lv  2A Term 09/21/13 [redacted]w[redacted]d   F CS-LTranv Spinal  LIV  2B Term 09/21/13 [redacted]w[redacted]d   F CS-LTranv Spinal  LIV  1 Term 2011     Vag-Spont   LIV    Past medical history,surgical history, problem list, medications, allergies, family history and social history were all reviewed and documented in the EPIC chart.   Directed ROS with pertinent positives and negatives documented in the history of present illness/assessment and plan.  Exam:  Vitals:   08/31/20 1332  BP: 124/78   General appearance:  Normal  Abdomen: Normal  Gynecologic exam: Vulva normal.  Speculum:  Cervix/Vagina normal.  IUD string visible at the exo-cervix.  Normal secretions, no bleeding.   Assessment/Plan:  35 y.o. G2P2003   1. Encounter for routine checking of intrauterine contraceptive device (IUD) Well on Mirena IUD since insertion March 02, 2020.  No complication.  IUD in good location with strings visible at the exocervix.  No sign of infection.  Patient reassured.  Follow-up for annual gynecologic exam.  March 04, 2020 MD, 1:44 PM 08/31/2020

## 2022-11-16 ENCOUNTER — Ambulatory Visit: Payer: BC Managed Care – PPO | Admitting: Obstetrics & Gynecology

## 2022-11-16 ENCOUNTER — Encounter: Payer: Self-pay | Admitting: Obstetrics & Gynecology

## 2022-11-16 VITALS — BP 110/70 | HR 86

## 2022-11-16 DIAGNOSIS — N8110 Cystocele, unspecified: Secondary | ICD-10-CM | POA: Diagnosis not present

## 2022-11-16 DIAGNOSIS — N8189 Other female genital prolapse: Secondary | ICD-10-CM

## 2022-11-16 NOTE — Progress Notes (Signed)
    Savannah Arnold [birth year] [id number]        37 y.o.  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  No vaginal pain.  No SUI.  Sometimes feels that a little bit of urine is left in the bladder after miction.  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  No pain with IC.  No pelvic pain.  Well with Mirena IUD x 02/2020.  Family h/o weak pelvic floor in mother.   OB History  Gravida Para Term Preterm AB Living  2 2 2     3   SAB IAB Ectopic Multiple Live Births        1 3    # Outcome Date GA Lbr Len/2nd Weight Sex Delivery Anes PTL Lv  2A Term 09/21/13 [redacted]w[redacted]d   F CS-LTranv Spinal  LIV  2B Term 09/21/13 [redacted]w[redacted]d   F CS-LTranv Spinal  LIV  1 Term 2011     Vag-Spont   LIV    Past medical history,surgical history, problem list, medications, allergies, family history and social history were all reviewed and documented in the EPIC chart.   Directed ROS with pertinent positives and negatives documented in the history of present illness/assessment and plan.  Exam:  Vitals:   11/16/22 1020  BP: 110/70  Pulse: 86  SpO2: 99%   General appearance:  Normal  Gynecologic exam: Vulva normal.  Bimanual Uterus AV, normal volume, mobile, NT.  No adnexal mass. Small Cystocele-rectocele in laying down position with Valsalva.  Standing with Valsalva:  Cystocele grad 3/4 with valsalva in standing position.  Grade 1/3 Rectocele and Uterine prolapse grade 1/3.    Assessment/Plan:  37 y.o. G2P2003   1. Pelvic floor weakness in female Intermittent feeling of vaginal bulging x a few months.  No vaginal pain.  No SUI.  Sometimes feels that a little bit of urine is left in the bladder after miction.  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  No pain with IC.  No pelvic pain.  Well with Mirena IUD x 02/2020.  Family h/o weak pelvic floor in  mother.  See below. Refer to PT.  2. Baden-Walker grade 3 cystocele Cystocele grad 3/4 with valsalva in standing position.  Grade 1/3 Rectocele and Uterine prolapse grade 1/3.  Counseling done on Pelvic floor weakness. Surgical correction and pessary reviewed, but not recommended at this time.  Recommend avoiding pressure, emptying bladder before it is too full and treating constipation as needed.  Kegel exercises recommended.  Referred to Physical Therapy for pelvic floor reinforcement.  Other orders - amphetamine-dextroamphetamine (ADDERALL XR) 20 MG 24 hr capsule; Take 1 capsule by mouth every morning.   Princess Bruins MD, 10:29 AM 11/16/2022

## 2022-11-17 ENCOUNTER — Telehealth: Payer: Self-pay

## 2022-11-17 DIAGNOSIS — N8189 Other female genital prolapse: Secondary | ICD-10-CM

## 2022-11-17 DIAGNOSIS — N8110 Cystocele, unspecified: Secondary | ICD-10-CM

## 2022-11-17 NOTE — Telephone Encounter (Signed)
I called several in the area from [employer] and none offer pelvic PT. I will have to look some more when I have time.

## 2022-11-17 NOTE — Telephone Encounter (Signed)
Refer for Pelvic Floor Physical Therapy for Pelvic floor weakness with Cystocele Received: Julien Girt, MD  Ramond Craver, RMA Refer to PT Pelvic floor in area convenient to [facility].

## 2022-11-21 NOTE — Telephone Encounter (Signed)
Called patient and asked her to let me know what other areas might be convenient. I told her [place] or [place] may have an [employer] [facility] practice that offers but I will have to check around. Asked her to call me back with this info.

## 2023-02-20 ENCOUNTER — Other Ambulatory Visit: Payer: Self-pay | Admitting: Obstetrics and Gynecology

## 2023-02-20 DIAGNOSIS — Z1231 Encounter for screening mammogram for malignant neoplasm of breast: Secondary | ICD-10-CM

## 2023-03-14 ENCOUNTER — Ambulatory Visit: Payer: BC Managed Care – PPO

## 2023-03-15 ENCOUNTER — Ambulatory Visit
Admission: RE | Admit: 2023-03-15 | Discharge: 2023-03-15 | Disposition: A | Discharge status: Home / Self Care | Payer: BC Managed Care – PPO | Source: Ambulatory Visit | Attending: Obstetrics and Gynecology | Admitting: Obstetrics and Gynecology

## 2023-03-15 DIAGNOSIS — Z1231 Encounter for screening mammogram for malignant neoplasm of breast: Secondary | ICD-10-CM

## 2024-03-26 ENCOUNTER — Other Ambulatory Visit (HOSPITAL_COMMUNITY)
Admission: RE | Admit: 2024-03-26 | Discharge: 2024-03-26 | Disposition: A | Discharge status: Home / Self Care | Source: Ambulatory Visit | Attending: Obstetrics and Gynecology | Admitting: Obstetrics and Gynecology

## 2024-03-26 ENCOUNTER — Encounter: Payer: Self-pay | Admitting: Obstetrics and Gynecology

## 2024-03-26 ENCOUNTER — Ambulatory Visit (INDEPENDENT_AMBULATORY_CARE_PROVIDER_SITE_OTHER): Payer: Self-pay | Admitting: Obstetrics and Gynecology

## 2024-03-26 VITALS — BP 110/82 | HR 59 | Ht 63.0 in | Wt 165.0 lb

## 2024-03-26 DIAGNOSIS — N8110 Cystocele, unspecified: Secondary | ICD-10-CM | POA: Diagnosis not present

## 2024-03-26 DIAGNOSIS — N951 Menopausal and female climacteric states: Secondary | ICD-10-CM

## 2024-03-26 DIAGNOSIS — Z01419 Encounter for gynecological examination (general) (routine) without abnormal findings: Secondary | ICD-10-CM

## 2024-03-26 DIAGNOSIS — Z1231 Encounter for screening mammogram for malignant neoplasm of breast: Secondary | ICD-10-CM

## 2024-03-26 MED ORDER — FLUOXETINE HCL 20 MG PO CAPS: 20.0000 mg | ORAL_CAPSULE | Freq: Every day | ORAL | 11 refills | Status: AC

## 2024-03-26 MED ORDER — INTRAROSA 6.5 MG VA INST: 1.0000 | VAGINAL_INSERT | Freq: Every evening | VAGINAL | 12 refills | Status: DC | PRN

## 2024-03-26 NOTE — Patient Instructions (Signed)
 UROGYN REFERRAL: Dr. Bernardine Gillis and Dr. Marilynne at [facility] with [facility] office: [address], [address] [phone]  The referral was placed but it may take a couple of weeks for the visit to be coordinated.  Great news!!! Dr Gillis had a opening this month.  I have placed a surgery referral, but will need her input and decision on what she needs.  Just wanted you to have more information as far as scheduling time, costs etc.  Kate Africa has this information and will contact you. Dr. Gillis is usually is booked out months, so this is great news!  I am sure you will love her.  Dr. Glennon

## 2024-03-26 NOTE — Progress Notes (Signed)
 38 y.o. y.o. female here for annual exam. No LMP recorded. (Menstrual status: IUD).    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 can feel the cervix when she is voiding. Has to hold her voids as well, being a Runner, broadcasting/film/video.  Has wanted to take care of the prolapse but has been dealing with family needs. Mother had same prolapse then had hysterectomy started patch and then dx with breast cancer. Cautious of HRT use. Does report decreased libido, vaginal dryness, dyspareunia to try intrarosa . Stress with 3 kids, work, husband suffered tbi after anesthesia reaction with surgery.  He cannot work and sometimes feels like another child to her and she is tearful about the guilt of that. She loves him however, and will support him.  She is just overwhelmed with her responsibilities. Does not want more children Had horrible periods before her mirena  IUD and does not want to bleed.   HPI:  vaginal dryness.  No vaginal pain.  No SUI.  Sometimes feels that a little bit of urine is left in the bladder after miction.  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  No pain with IC.  No pelvic pain.  Well with Mirena  IUD x 02/2020.  Family h/o weak pelvic floor in mother. There is no height or weight on file to calculate BMI.      No data to display          There were no vitals taken for this visit.  No results found for: DIAGPAP, HPVHIGH, ADEQPAP  GYN HISTORY: No results found for: DIAGPAP, HPVHIGH, ADEQPAP  OB History  Gravida Para Term Preterm AB Living  2 2 2   3   SAB IAB Ectopic Multiple Live Births     1 3    # Outcome Date GA Lbr Len/2nd Weight Sex Type Anes PTL Lv  2A Term 09/21/13 [redacted]w[redacted]d   F CS-LTranv Spinal  LIV  2B Term 09/21/13 [redacted]w[redacted]d   F CS-LTranv Spinal  LIV  1 Term 2011     Vag-Spont   LIV    Past Medical History:  Diagnosis Date   Anemia    Asthma    GERD (gastroesophageal reflux disease)    with  pregnancy   Pericarditis    after cold symptoms   Postpartum care following cesarean delivery (1/31) 09/21/2013   PUPPP (pruritic urticarial papules and plaques of pregnancy)    currently    Past Surgical History:  Procedure Laterality Date   CESAREAN SECTION N/A 09/21/2013   Procedure: Primary CESAREAN SECTION  Twins;  Surgeon: Percilla Burly, MD;  Location: [facility];  Service: Obstetrics;  Laterality: N/A;  EDD: 10/12/13   facial plastic surgery     birth mark removed from forehead   WISDOM TOOTH EXTRACTION      Current Outpatient Medications on File Prior to Visit  Medication Sig Dispense Refill   acetaminophen  (TYLENOL ) 500 MG tablet Take 1,000 mg by mouth every 6 (six) hours as needed for mild pain or moderate pain.      albuterol  (PROVENTIL  HFA;VENTOLIN  HFA) 108 (90 Base) MCG/ACT inhaler Inhale 2 puffs into the lungs every 6 (six) hours as needed for shortness of breath (asthma). 1 Inhaler 3   albuterol  (PROVENTIL ) (2.5 MG/3ML) 0.083% nebulizer solution Take 3 mLs (2.5 mg total) by nebulization every 6 (six) hours as needed for wheezing or shortness of breath. 150 mL 1   amphetamine-dextroamphetamine (ADDERALL XR)  20 MG 24 hr capsule Take 1 capsule by mouth every morning.     busPIRone (BUSPAR) 5 MG tablet Take 1 tablet by mouth daily.     calcium carbonate (TUMS - DOSED IN MG ELEMENTAL CALCIUM) 500 MG chewable tablet Chew 2 tablets by mouth daily as needed for indigestion or heartburn.      ibuprofen  (ADVIL ,MOTRIN ) 600 MG tablet Take 1 tablet (600 mg total) by mouth every 6 (six) hours. 30 tablet 1   levonorgestrel  (MIRENA ) 20 MCG/24HR IUD by Intrauterine route.     No current facility-administered medications on file prior to visit.    Social History   Socioeconomic History   Marital status: Married    Spouse name: Not on file   Number of children: Not on file   Years of education: Not on file   Highest education level: Not on file  Occupational History   Not on file   Tobacco Use   Smoking status: Never   Smokeless tobacco: Never  Vaping Use   Vaping status: Never Used  Substance and Sexual Activity   Alcohol use: Yes    Comment: social   Drug use: No   Sexual activity: Yes    Partners: Male    Birth control/protection: I.U.D.    Comment: First sexual intercourse at 38 yrs  old. less than 5  Other Topics Concern   Not on file  Social History Narrative   Not on file   Social Drivers of Health   Financial Resource Strain: Low Risk  (09/14/2022)   Received from [facility]   Overall Financial Resource Strain (CARDIA)    Difficulty of Paying Living Expenses: Not very hard  Food Insecurity: No Food Insecurity (09/14/2022)   [facility] from [facility]   Hunger Vital Sign    Worried About Running Out of Food in the Last Year: Never true    Ran Out of Food in the Last Year: Never true  Transportation Needs: No Transportation Needs (09/14/2022)   Received from [facility] - Transportation    Lack of Transportation (Medical): No    Lack of Transportation (Non-Medical): No  Physical Activity: Not on file  Stress: Not on file  Social Connections: Unknown (12/21/2021)   [facility] from [facility]   Social Network    Social Network: Not on file  Intimate Partner Violence: Unknown (11/22/2021)   Received from [facility]   HITS    Physically Hurt: Not on file    Insult or Talk Down To: Not on file    Threaten Physical Harm: Not on file    Scream or Curse: Not on file    Family History  Problem Relation Age of Onset   Breast cancer Mother 14   Breast cancer Cousin        24s   Breast cancer Cousin        30s     Allergies  Allergen Reactions   Cephalexin Anaphylaxis    Unknown reaction- heart issue   Elemental Sulfur Itching   Sulfa Antibiotics Itching and Rash   Tape Rash    TAPE USED FOR EPIDURAL TAPE USED FOR EPIDURAL   Sulfamethoxazole Itching      Patient's last menstrual period was No LMP recorded.  (Menstrual status: IUD)..            Review of Systems Alls systems reviewed and are negative.     Physical Exam Constitutional:      Appearance: Normal appearance.  Genitourinary:     Vulva and urethral meatus normal.     No lesions in the vagina.     Genitourinary Comments: Cervix at introitus     Right Labia: No rash, lesions or skin changes.    Left Labia: No lesions, skin changes or rash.    No vaginal discharge or tenderness.     Apical vaginal prolapse present.    No vaginal atrophy present.     Right Adnexa: not tender, not palpable and no mass present.    Left Adnexa: not tender, not palpable and no mass present.    No cervical motion tenderness or discharge.     IUD strings visualized.     Uterus is not enlarged, tender or irregular.  Breasts:    Right: Normal.     Left: Normal.  HENT:     Head: Normocephalic.  Neck:     Thyroid: No thyroid mass, thyromegaly or thyroid tenderness.  Cardiovascular:     Rate and Rhythm: Normal rate and regular rhythm.     Heart sounds: Normal heart sounds, S1 normal and S2 normal.  Pulmonary:     Effort: Pulmonary effort is normal.     Breath sounds: Normal breath sounds and air entry.  Abdominal:     General: There is no distension.     Palpations: Abdomen is soft. There is no mass.     Tenderness: There is no abdominal tenderness. There is no guarding or rebound.  Musculoskeletal:        General: Normal range of motion.     Cervical back: Full passive range of motion without pain, normal range of motion and neck supple. No tenderness.     Right lower leg: No edema.     Left lower leg: No edema.  Neurological:     Mental Status: She is alert.  Skin:    General: Skin is warm.  Psychiatric:        Mood and Affect: Mood normal.        Behavior: Behavior normal.        Thought Content: Thought content normal.  Vitals and nursing note reviewed. Exam conducted with a chaperone present.       A:         Well Woman GYN  exam             Situational stress Vaginal prolapse Menorrhagia before mirena  IUD                 P:        Pap smear collected today Encouraged annual mammogram screening Colon cancer screening not indicated DXA not indicated Labs and immunizations ordered today Prolapse with history of heavy painful periods done with childbearing. Discussed option with combined surgery with RLH and prolapse repair with Dr. Guadlupe.  She will see her for consult and recommendations Encouraged healthy lifestyle practices Encouraged Vit D and Calcium   No follow-ups on file.  Savannah Arnold Carpen

## 2024-03-27 ENCOUNTER — Ambulatory Visit: Payer: Self-pay | Admitting: Obstetrics and Gynecology

## 2024-03-27 LAB — SURESWAB® ADVANCED VAGINITIS PLUS,TMA
C. trachomatis RNA, TMA: NOT DETECTED
CANDIDA SPECIES: DETECTED — AB
Candida glabrata: NOT DETECTED
N. gonorrhoeae RNA, TMA: NOT DETECTED
SURESWAB(R) ADV BACTERIAL VAGINOSIS(BV),TMA: NEGATIVE
TRICHOMONAS VAGINALIS (TV),TMA: NOT DETECTED

## 2024-03-28 MED ORDER — FLUCONAZOLE 150 MG PO TABS: ORAL_TABLET | ORAL | 0 refills | Status: DC

## 2024-03-30 LAB — COMPREHENSIVE METABOLIC PANEL WITH GFR
AG Ratio: 1.9 (calc) (ref 1.0–2.5)
ALT: 8 U/L (ref 6–29)
AST: 10 U/L (ref 10–30)
Albumin: 4.5 g/dL (ref 3.6–5.1)
Alkaline phosphatase (APISO): 45 U/L (ref 31–125)
BUN: 12 mg/dL (ref 7–25)
CO2: 26 mmol/L (ref 20–32)
Calcium: 9.1 mg/dL (ref 8.6–10.2)
Chloride: 104 mmol/L (ref 98–110)
Creat: 0.8 mg/dL (ref 0.50–0.97)
Globulin: 2.4 g/dL (ref 1.9–3.7)
Glucose, Bld: 85 mg/dL (ref 65–99)
Potassium: 4.2 mmol/L (ref 3.5–5.3)
Sodium: 138 mmol/L (ref 135–146)
Total Bilirubin: 0.6 mg/dL (ref 0.2–1.2)
Total Protein: 6.9 g/dL (ref 6.1–8.1)
eGFR: 96 mL/min/1.73m2 (ref >=60)

## 2024-03-30 LAB — LIPID PANEL
Cholesterol: 156 mg/dL (ref <200)
HDL: 57 mg/dL (ref >=50)
LDL Cholesterol (Calc): 85 mg/dL
Non-HDL Cholesterol (Calc): 99 mg/dL (ref <130)
Total CHOL/HDL Ratio: 2.7 (calc) (ref <5.0)
Triglycerides: 56 mg/dL (ref <150)

## 2024-03-30 LAB — FOLLICLE STIMULATING HORMONE: FSH: 8.4 m[IU]/mL

## 2024-03-30 LAB — CBC
HCT: 39.5 % (ref 35.0–45.0)
Hemoglobin: 13.2 g/dL (ref 11.7–15.5)
MCH: 31.2 pg (ref 27.0–33.0)
MCHC: 33.4 g/dL (ref 32.0–36.0)
MCV: 93.4 fL (ref 80.0–100.0)
MPV: 11.1 fL (ref 7.5–12.5)
Platelets: 221 Thousand/uL (ref 140–400)
RBC: 4.23 Million/uL (ref 3.80–5.10)
RDW: 11.8 % (ref 11.0–15.0)
WBC: 5.9 Thousand/uL (ref 3.8–10.8)

## 2024-03-30 LAB — TSH: TSH: 1.23 m[IU]/L

## 2024-03-30 LAB — HEMOGLOBIN A1C
Hgb A1c MFr Bld: 5.3 % (ref <5.7)
Mean Plasma Glucose: 105 mg/dL
eAG (mmol/L): 5.8 mmol/L

## 2024-03-30 LAB — ESTRADIOL: Estradiol: 88 pg/mL

## 2024-03-30 LAB — VITAMIN D 25 HYDROXY (VIT D DEFICIENCY, FRACTURES): Vit D, 25-Hydroxy: 48 ng/mL (ref 30–100)

## 2024-03-30 LAB — TESTOS,TOTAL,FREE AND SHBG (FEMALE)
Free Testosterone: 2.6 pg/mL (ref 0.1–6.4)
Sex Hormone Binding: 44 nmol/L (ref 17–124)
Testosterone, Total, LC-MS-MS: 19 ng/dL (ref 2–45)

## 2024-04-02 LAB — CYTOLOGY - PAP
Comment: NEGATIVE
Diagnosis: NEGATIVE
Diagnosis: REACTIVE
High risk HPV: NEGATIVE

## 2024-06-04 ENCOUNTER — Telehealth: Admitting: Family Medicine

## 2024-06-04 DIAGNOSIS — J208 Acute bronchitis due to other specified organisms: Secondary | ICD-10-CM

## 2024-06-04 DIAGNOSIS — B9689 Other specified bacterial agents as the cause of diseases classified elsewhere: Secondary | ICD-10-CM

## 2024-06-04 MED ORDER — AMOXICILLIN-POT CLAVULANATE 875-125 MG PO TABS: 1.0000 | ORAL_TABLET | Freq: Two times a day (BID) | ORAL | 0 refills | Status: AC

## 2024-06-04 MED ORDER — FLUCONAZOLE 150 MG PO TABS: 150.0000 mg | ORAL_TABLET | ORAL | 0 refills | Status: DC

## 2024-06-04 MED ORDER — PROMETHAZINE-DM 6.25-15 MG/5ML PO SYRP: 5.0000 mL | ORAL_SOLUTION | Freq: Four times a day (QID) | ORAL | 0 refills | Status: DC | PRN

## 2024-06-04 MED ORDER — ALBUTEROL SULFATE (2.5 MG/3ML) 0.083% IN NEBU: 2.5000 mg | INHALATION_SOLUTION | Freq: Four times a day (QID) | RESPIRATORY_TRACT | 1 refills | Status: AC | PRN

## 2024-06-04 MED ORDER — BENZONATATE 100 MG PO CAPS: 200.0000 mg | ORAL_CAPSULE | Freq: Three times a day (TID) | ORAL | 0 refills | Status: DC | PRN

## 2024-06-04 NOTE — Patient Instructions (Addendum)
 Geni Lisle, thank you for joining [name], NP for today's virtual visit.  While this provider is not your primary care provider (PCP), if your PCP is located in our provider database this encounter information will be shared with them immediately following your visit.   A [facility] MyChart account gives you access to today's visit and all your visits, tests, and labs performed at [facility]  click here if you don't have a [facility] MyChart account or go to [email or web address]  Consent: (Patient) Savannah Arnold provided verbal consent for this virtual visit at the beginning of the encounter.  Current Medications:  Current Outpatient Medications:    amoxicillin -clavulanate (AUGMENTIN ) 875-125 MG tablet, Take 1 tablet by mouth 2 (two) times daily for 7 days., Disp: 14 tablet, Rfl: 0   benzonatate  (TESSALON ) 100 MG capsule, Take 2 capsules (200 mg total) by mouth 3 (three) times daily as needed for cough., Disp: 30 capsule, Rfl: 0   fluconazole  (DIFLUCAN ) 150 MG tablet, Take 1 tablet (150 mg total) by mouth as directed. Repeat in 3 days as needed, Disp: 2 tablet, Rfl: 0   promethazine -dextromethorphan (PROMETHAZINE -DM) 6.25-15 MG/5ML syrup, Take 5 mLs by mouth 4 (four) times daily as needed for cough., Disp: 118 mL, Rfl: 0   acetaminophen  (TYLENOL ) 500 MG tablet, Take 1,000 mg by mouth every 6 (six) hours as needed for mild pain or moderate pain. , Disp: , Rfl:    albuterol  (PROVENTIL  HFA;VENTOLIN  HFA) 108 (90 Base) MCG/ACT inhaler, Inhale 2 puffs into the lungs every 6 (six) hours as needed for shortness of breath (asthma)., Disp: 1 Inhaler, Rfl: 3   albuterol  (PROVENTIL ) (2.5 MG/3ML) 0.083% nebulizer solution, Take 3 mLs (2.5 mg total) by nebulization every 6 (six) hours as needed for wheezing or shortness of breath., Disp: 150 mL, Rfl: 1   amphetamine-dextroamphetamine (ADDERALL XR) 20 MG 24 hr capsule, Take 1 capsule by mouth every morning., Disp: , Rfl:     busPIRone (BUSPAR) 5 MG tablet, Take 1 tablet by mouth daily., Disp: , Rfl:    calcium carbonate (TUMS - DOSED IN MG ELEMENTAL CALCIUM) 500 MG chewable tablet, Chew 2 tablets by mouth daily as needed for indigestion or heartburn. , Disp: , Rfl:    FLUoxetine  (PROZAC ) 20 MG capsule, Take 1 capsule (20 mg total) by mouth at bedtime., Disp: 60 capsule, Rfl: 11   ibuprofen  (ADVIL ,MOTRIN ) 600 MG tablet, Take 1 tablet (600 mg total) by mouth every 6 (six) hours., Disp: 30 tablet, Rfl: 1   levonorgestrel  (MIRENA ) 20 MCG/24HR IUD, by Intrauterine route., Disp: , Rfl:    Prasterone  (INTRAROSA ) 6.5 MG INST, Place 1 suppository vaginally at bedtime as needed., Disp: 30 each, Rfl: 12   Medications ordered in this encounter:  Meds ordered this encounter  Medications   amoxicillin -clavulanate (AUGMENTIN ) 875-125 MG tablet    Sig: Take 1 tablet by mouth 2 (two) times daily for 7 days.    Dispense:  14 tablet    Refill:  0    Supervising Provider:   BLAISE ALEENE KIDD [[id number]]   fluconazole  (DIFLUCAN ) 150 MG tablet    Sig: Take 1 tablet (150 mg total) by mouth as directed. Repeat in 3 days as needed    Dispense:  2 tablet    Refill:  0    Supervising Provider:   LAMPTEY, PHILIP O [[id number]]   benzonatate  (TESSALON ) 100 MG capsule    Sig: Take 2 capsules (200 mg total) by mouth 3 (three) times daily as  needed for cough.    Dispense:  30 capsule    Refill:  0    Supervising Provider:   LAMPTEY, PHILIP O [[id number]]   promethazine -dextromethorphan (PROMETHAZINE -DM) 6.25-15 MG/5ML syrup    Sig: Take 5 mLs by mouth 4 (four) times daily as needed for cough.    Dispense:  118 mL    Refill:  0    Supervising Provider:   LAMPTEY, PHILIP O [id number]   albuterol  (PROVENTIL ) (2.5 MG/3ML) 0.083% nebulizer solution    Sig: Take 3 mLs (2.5 mg total) by nebulization every 6 (six) hours as needed for wheezing or shortness of breath.    Dispense:  150 mL    Refill:  1    Supervising Provider:   BLAISE ALEENE KIDD  [[id number]]     *If you need refills on other medications prior to your next appointment, please contact your pharmacy*  Follow-Up: Call back or seek an in-person evaluation if the symptoms worsen or if the condition fails to improve as anticipated.  [facility] [phone]  Other Instructions  - Take meds as prescribed - Rest voice - Use a cool mist humidifier especially during the winter months when heat dries out the air. - Use saline nose sprays frequently to help soothe nasal passages if they are drying out. - Stay hydrated by drinking plenty of fluids - Keep thermostat turn down low to prevent drying out which can cause a dry cough.    If you have been instructed to have an in-person evaluation today at a local Urgent Care facility, please use the link below. It will take you to a list of all of our available [facility], including address, phone number and hours of operation. Please do not delay care.  [facility]  If you or a family member do not have a primary care provider, use the link below to schedule a visit and establish care. When you choose a [facility] primary care physician or advanced practice provider, you gain a long-term partner in health. Find a Primary Care Provider  Learn more about [facility] in-office and virtual care options: [facility] - Get Care Now

## 2024-06-04 NOTE — Progress Notes (Signed)
 Virtual Visit Consent   Savannah Arnold, you are scheduled for a virtual visit with a  provider today. Just as with appointments in the office, your consent must be obtained to participate. Your consent will be active for this visit and any virtual visit you may have with one of our providers in the next 365 days. If you have a MyChart account, a copy of this consent can be sent to you electronically.  As this is a virtual visit, video technology does not allow for your provider to perform a traditional examination. This may limit your provider's ability to fully assess your condition. If your provider identifies any concerns that need to be evaluated in person or the need to arrange testing (such as labs, EKG, etc.), we will make arrangements to do so. Although advances in technology are sophisticated, we cannot ensure that it will always work on either your end or our end. If the connection with a video visit is poor, the visit may have to be switched to a telephone visit. With either a video or telephone visit, we are not always able to ensure that we have a secure connection.  By engaging in this virtual visit, you consent to the provision of healthcare and authorize for your insurance to be billed (if applicable) for the services provided during this visit. Depending on your insurance coverage, you may receive a charge related to this service.  I need to obtain your verbal consent now. Are you willing to proceed with your visit today? Savannah Arnold has provided verbal consent on 06/04/2024 for a virtual visit (video or telephone). Savannah CHRISTELLA Barefoot, NP  Date: 06/04/2024 9:34 AM   Virtual Visit via Video Note   I, Savannah Arnold, connected with  Savannah Arnold  ([id number], May 24, 1986) on 06/04/24 at  9:30 AM EDT by a video-enabled telemedicine application and verified that I am speaking with the correct person using two identifiers.  Location: Patient: Virtual Visit Location Patient:  Home Provider: Virtual Visit Location Provider: Home Office   I discussed the limitations of evaluation and management by telemedicine and the availability of in person appointments. The patient expressed understanding and agreed to proceed.    History of Present Illness: Savannah Arnold is a 38 y.o. 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  COVID test:  none Vaccines: none    Problems:  Patient Active Problem List   Diagnosis Date Noted   Postpartum care following cesarean delivery (1/31) 09/21/2013   Postoperative state 09/21/2013   Twins 09/14/2013    Allergies:  Allergies  Allergen Reactions   Cephalexin Anaphylaxis    Unknown reaction- heart issue   Elemental Sulfur Itching   Sulfa Antibiotics Itching and Rash   Tape Rash    TAPE USED FOR EPIDURAL TAPE USED FOR EPIDURAL   Sulfamethoxazole Itching   Medications:  Current Outpatient Medications:    acetaminophen  (TYLENOL ) 500 MG tablet, Take 1,000 mg by mouth every 6 (six) hours as needed for mild pain or moderate pain. , Disp: , Rfl:    albuterol  (PROVENTIL  HFA;VENTOLIN  HFA) 108 (  90 Base) MCG/ACT inhaler, Inhale 2 puffs into the lungs every 6 (six) hours as needed for shortness of breath (asthma)., Disp: 1 Inhaler, Rfl: 3   albuterol  (PROVENTIL ) (2.5 MG/3ML) 0.083% nebulizer solution, Take 3 mLs (2.5 mg total) by nebulization every 6 (six) hours as needed for wheezing or shortness of breath., Disp: 150 mL, Rfl: 1   amphetamine-dextroamphetamine (ADDERALL XR) 20 MG  24 hr capsule, Take 1 capsule by mouth every morning., Disp: , Rfl:    busPIRone (BUSPAR) 5 MG tablet, Take 1 tablet by mouth daily., Disp: , Rfl:    calcium carbonate (TUMS - DOSED IN MG ELEMENTAL CALCIUM) 500 MG chewable tablet, Chew 2 tablets by mouth daily as needed for indigestion or heartburn. , Disp: , Rfl:    fluconazole  (DIFLUCAN ) 150 MG tablet, Take 1 po & repeat in 48hrs if needed., Disp: 2 tablet, Rfl: 0   FLUoxetine  (PROZAC ) 20 MG capsule, Take 1 capsule (20 mg total) by mouth at bedtime., Disp: 60 capsule, Rfl: 11   ibuprofen  (ADVIL ,MOTRIN ) 600 MG tablet, Take 1 tablet (600 mg total) by mouth every 6 (six) hours., Disp: 30 tablet, Rfl: 1   levonorgestrel  (MIRENA ) 20 MCG/24HR IUD, by Intrauterine route., Disp: , Rfl:    Prasterone  (INTRAROSA ) 6.5 MG INST, Place 1 suppository vaginally at bedtime as needed., Disp: 30 each, Rfl: 12  Observations/Objective: Patient is well-developed, well-nourished in no acute distress.  Resting comfortably  at home.  Head is normocephalic, atraumatic.  No labored breathing.  Speech is clear and coherent with logical content.  Patient is alert and oriented at baseline.  Cough  Hoarseness   Assessment and Plan:  1. Acute bacterial bronchitis  - amoxicillin -clavulanate (AUGMENTIN ) 875-125 MG tablet; Take 1 tablet by mouth 2 (two) times daily for 7 days.  Dispense: 14 tablet; Refill: 0 - fluconazole  (DIFLUCAN ) 150 MG tablet; Take 1 tablet (150 mg total) by mouth as directed. Repeat in 3 days as needed  Dispense: 2 tablet; Refill: 0 - benzonatate  (TESSALON ) 100 MG capsule; Take 2 capsules (200 mg total) by mouth 3 (three) times daily as needed for cough.  Dispense: 30 capsule; Refill: 0 - promethazine -dextromethorphan (PROMETHAZINE -DM) 6.25-15 MG/5ML syrup; Take 5 mLs by mouth 4 (four) times daily as needed for cough.  Dispense: 118 mL; Refill: 0 - albuterol  (PROVENTIL ) (2.5 MG/3ML) 0.083% nebulizer solution; Take 3 mLs (2.5 mg total) by nebulization  every 6 (six) hours as needed for wheezing or shortness of breath.  Dispense: 150 mL; Refill: 1  - Take meds as prescribed - Rest voice - Use a cool mist humidifier especially during the winter months when heat dries out the air. - Use saline nose sprays frequently to help soothe nasal passages if they are drying out. - Stay hydrated by drinking plenty of fluids - Keep thermostat turn down low to prevent drying out which can cause a dry cough.  If you do not improve you will need a follow up visit in person.               Reviewed side effects, risks and benefits of medication.    Patient acknowledged agreement and understanding of the plan.   Past Medical, Surgical, Social History, Allergies, and Medications have been Reviewed.    Follow Up Instructions: I discussed the assessment and treatment plan with the patient. The patient was provided an opportunity to ask questions and all were answered. The patient agreed with the plan and demonstrated an understanding of the instructions.  A  copy of instructions were sent to the patient via MyChart unless otherwise noted below.    The patient was advised to call back or seek an in-person evaluation if the symptoms worsen or if the condition fails to improve as anticipated.    Savannah CHRISTELLA Barefoot, NP

## 2024-07-07 ENCOUNTER — Ambulatory Visit
Admission: EM | Admit: 2024-07-07 | Discharge: 2024-07-07 | Disposition: A | Discharge status: Home / Self Care | Attending: Family Medicine | Admitting: Family Medicine

## 2024-07-07 ENCOUNTER — Other Ambulatory Visit: Payer: Self-pay

## 2024-07-07 ENCOUNTER — Encounter: Payer: Self-pay | Admitting: Emergency Medicine

## 2024-07-07 DIAGNOSIS — R42 Dizziness and giddiness: Secondary | ICD-10-CM | POA: Diagnosis not present

## 2024-07-07 DIAGNOSIS — H66001 Acute suppurative otitis media without spontaneous rupture of ear drum, right ear: Secondary | ICD-10-CM | POA: Diagnosis not present

## 2024-07-07 MED ORDER — AZITHROMYCIN 250 MG PO TABS: ORAL_TABLET | ORAL | 0 refills | Status: DC

## 2024-07-07 MED ORDER — PREDNISONE 20 MG PO TABS: 40.0000 mg | ORAL_TABLET | Freq: Every day | ORAL | 0 refills | Status: DC

## 2024-07-07 MED ORDER — MECLIZINE HCL 25 MG PO TABS: 25.0000 mg | ORAL_TABLET | Freq: Three times a day (TID) | ORAL | 0 refills | Status: DC | PRN

## 2024-07-07 NOTE — ED Provider Notes (Signed)
 [facility]    CSN: [id number] Arrival date & time: 07/07/24  1206      History   Chief Complaint Chief Complaint  Patient presents with   Ear Pain    HPI Savannah Arnold is a 38 y.o. female.   Patient presenting today with about 5-day history of progressively worsening right ear pain, pressure, room spinning dizziness, headache.  Denies significant congestion, cough, chest pain, shortness of breath, abdominal pain, vomiting, diarrhea.  So far trying multiple nasal sprays, Sudafed, flushing out ears all with minimal relief.    Past Medical History:  Diagnosis Date   Anemia    Asthma    GERD (gastroesophageal reflux disease)    with pregnancy   Pericarditis    after cold symptoms   Postpartum care following cesarean delivery (1/31) 09/21/2013   PUPPP (pruritic urticarial papules and plaques of pregnancy)    currently   Uterine prolapse     Patient Active Problem List   Diagnosis Date Noted   Postpartum care following cesarean delivery (1/31) 09/21/2013   Postoperative state 09/21/2013   Twins 09/14/2013    Past Surgical History:  Procedure Laterality Date   CESAREAN SECTION N/A 09/21/2013   Procedure: Primary CESAREAN SECTION  Twins;  Surgeon: Marie-Lyne Lavoie, MD;  Location: [facility];  Service: Obstetrics;  Laterality: N/A;  EDD: 10/12/13   facial plastic surgery     birth mark removed from forehead   WISDOM TOOTH EXTRACTION      OB History     Gravida  2   Para  2   Term  2   Preterm      AB      Living  3      SAB      IAB      Ectopic      Multiple  1   Live Births  3            Home Medications    Prior to Admission medications   Medication Sig Start Date End Date Taking? Authorizing Provider  azithromycin (ZITHROMAX) 250 MG tablet Take first 2 tablets together, then 1 every day until finished. 07/07/24  Yes Stuart Vernell Norris, PA-C  meclizine (ANTIVERT) 25 MG tablet Take 1 tablet (25 mg total) by mouth 3 (three)  times daily as needed for dizziness. May cause drowsiness 07/07/24  Yes Stuart Vernell Norris, PA-C  predniSONE  (DELTASONE ) 20 MG tablet Take 2 tablets (40 mg total) by mouth daily with breakfast. 07/07/24  Yes Stuart Vernell Norris, PA-C  acetaminophen  (TYLENOL ) 500 MG tablet Take 1,000 mg by mouth every 6 (six) hours as needed for mild pain or moderate pain.     [provider]  albuterol  (PROVENTIL  HFA;VENTOLIN  HFA) 108 (90 Base) MCG/ACT inhaler Inhale 2 puffs into the lungs every 6 (six) hours as needed for shortness of breath (asthma). 08/16/18   Withrow, Norleen BROCKS, FNP  albuterol  (PROVENTIL ) (2.5 MG/3ML) 0.083% nebulizer solution Take 3 mLs (2.5 mg total) by nebulization every 6 (six) hours as needed for wheezing or shortness of breath. 06/04/24   Moishe Chiquita HERO, NP  amphetamine-dextroamphetamine (ADDERALL XR) 20 MG 24 hr capsule Take 1 capsule by mouth every morning. 11/16/22   [provider]  benzonatate  (TESSALON ) 100 MG capsule Take 2 capsules (200 mg total) by mouth 3 (three) times daily as needed for cough. 06/04/24   Moishe Chiquita HERO, NP  busPIRone (BUSPAR) 5 MG tablet Take 1 tablet by mouth daily. 09/13/19  [provider]  calcium carbonate (TUMS - DOSED IN MG ELEMENTAL CALCIUM) 500 MG chewable tablet Chew 2 tablets by mouth daily as needed for indigestion or heartburn.     [provider]  fluconazole  (DIFLUCAN ) 150 MG tablet Take 1 tablet (150 mg total) by mouth as directed. Repeat in 3 days as needed 06/04/24   Moishe Chiquita HERO, NP  FLUoxetine  (PROZAC ) 20 MG capsule Take 1 capsule (20 mg total) by mouth at bedtime. 03/26/24   Glennon Almarie POUR, MD  ibuprofen  (ADVIL ,MOTRIN ) 600 MG tablet Take 1 tablet (600 mg total) by mouth every 6 (six) hours. 09/24/13   Letha Renshaw, CNM  levonorgestrel  (MIRENA ) 20 MCG/24HR IUD by Intrauterine route.    [provider]  Prasterone  (INTRAROSA ) 6.5 MG INST Place 1 suppository vaginally at bedtime as needed.  03/26/24   Glennon Almarie POUR, MD  promethazine -dextromethorphan (PROMETHAZINE -DM) 6.25-15 MG/5ML syrup Take 5 mLs by mouth 4 (four) times daily as needed for cough. 06/04/24   Moishe Chiquita HERO, NP    Family History Family History  Problem Relation Age of Onset   Breast cancer Mother 55   Breast cancer Cousin        50s   Breast cancer Cousin        51s    Social History Social History   Tobacco Use   Smoking status: Never   Smokeless tobacco: Never  Vaping Use   Vaping status: Never Used  Substance Use Topics   Alcohol use: Yes    Comment: social   Drug use: No     Allergies   Cephalexin, Elemental sulfur, Sulfa antibiotics, Tape, and Sulfamethoxazole   Review of Systems Review of Systems Per HPI  Physical Exam Triage Vital Signs ED Triage Vitals  Encounter Vitals Group     BP 07/07/24 1218 118/87     Girls Systolic BP Percentile --      Girls Diastolic BP Percentile --      Boys Systolic BP Percentile --      Boys Diastolic BP Percentile --      Pulse Rate 07/07/24 1218 76     Resp 07/07/24 1218 20     Temp 07/07/24 1218 98.4 F (36.9 C)     Temp Source 07/07/24 1218 Oral     SpO2 07/07/24 1218 97 %     Weight --      Height --      Head Circumference --      Peak Flow --      Pain Score 07/07/24 1216 7     Pain Loc --      Pain Education --      Exclude from Growth Chart --    No data found.  Updated Vital Signs BP 118/87 (BP Location: Right Arm)   Pulse 76   Temp 98.4 F (36.9 C) (Oral)   Resp 20   SpO2 97%   Visual Acuity Right Eye Distance:   Left Eye Distance:   Bilateral Distance:    Right Eye Near:   Left Eye Near:    Bilateral Near:     Physical Exam Vitals and nursing note reviewed.  Constitutional:      Appearance: Normal appearance. She is not ill-appearing.  HENT:     Head: Atraumatic.     Ears:     Comments: Right TM erythematous and edematous    Nose: Nose normal.     Mouth/Throat:     Mouth: Mucous membranes are  moist.     Pharynx: Oropharynx is clear.  Eyes:     Extraocular Movements: Extraocular movements intact.     Conjunctiva/sclera: Conjunctivae normal.  Cardiovascular:     Rate and Rhythm: Normal rate and regular rhythm.     Heart sounds: Normal heart sounds.  Pulmonary:     Effort: Pulmonary effort is normal.     Breath sounds: Normal breath sounds.  Musculoskeletal:        General: Normal range of motion.     Cervical back: Normal range of motion and neck supple.  Skin:    General: Skin is warm and dry.  Neurological:     General: No focal deficit present.     Mental Status: She is alert and oriented to person, place, and time.     Cranial Nerves: No cranial nerve deficit.     Motor: No weakness.     Gait: Gait normal.  Psychiatric:        Mood and Affect: Mood normal.        Thought Content: Thought content normal.        Judgment: Judgment normal.      UC Treatments / Results  Labs (all labs ordered are listed, but only abnormal results are displayed) Labs Reviewed - No data to display  EKG   Radiology No results found.  Procedures Procedures (including critical care time)  Medications Ordered in UC Medications - No data to display  Initial Impression / Assessment and Plan / UC Course  I have reviewed the triage vital signs and the nursing notes.  Pertinent labs & imaging results that were available during my care of the patient were reviewed by me and considered in my medical decision making (see chart for details).     Vital signs within normal limits, overall exam reassuring with no focal neurologic deficits.  Suspect right ear infection triggering vertigo episodes.  Will treat with Zithromax, prednisone , meclizine, Epley maneuvers, supportive over-the-counter medications and home care.  Return for worsening or unresolving symptoms.  Final Clinical Impressions(s) / UC Diagnoses   Final diagnoses:  Acute suppurative otitis media of right ear without  spontaneous rupture of tympanic membrane, recurrence not specified  Vertigo   Discharge Instructions   None    ED Prescriptions     Medication Sig Dispense Auth. Provider   azithromycin (ZITHROMAX) 250 MG tablet Take first 2 tablets together, then 1 every day until finished. 6 tablet Stuart Vernell Norris, PA-C   predniSONE  (DELTASONE ) 20 MG tablet Take 2 tablets (40 mg total) by mouth daily with breakfast. 10 tablet Stuart Vernell Norris, PA-C   meclizine (ANTIVERT) 25 MG tablet Take 1 tablet (25 mg total) by mouth 3 (three) times daily as needed for dizziness. May cause drowsiness 15 tablet Stuart Vernell Norris, [place]      PDMP not reviewed this encounter.   Stuart Vernell Norris, PA-C 07/07/24 1309

## 2024-07-07 NOTE — ED Triage Notes (Signed)
 Pt reports right ear pain with dizziness x5 days. Has tried nasal sprays, sudafed, ear irrigation, and states dizziness with nausea persists.

## 2024-07-12 NOTE — Progress Notes (Unsigned)
 New Patient Evaluation and Consultation  Referring Provider: Glennon Almarie POUR, MD PCP: Freddrick, No Date of Service: 07/15/2024  SUBJECTIVE Chief Complaint: No chief complaint on file.  History of Present Illness: Maleena Eddleman is a 38 y.o. {ED SANE [phone] female seen in consultation at the request of Dr Glennon for evaluation of pelvic organ prolapse.    Tried intrarosa  for decreased libido, vaginal dryness and dyspareunia.  Husband suffered from TBI after anesthesia reaction from srugery  ***Review of records significant for: ***  Urinary Symptoms: {urine leakage?:24754} Leaks *** time(s) per {days/wks/mos/yrs:310907}.  Pad use: {NUMBERS 1-10:18281} {pad option:24752} per day.   Patient {ACTION; IS/IS WNU:78978602} bothered by UI symptoms.  Day time voids ***.  Nocturia: *** times per night to void. Voiding dysfunction:  {empties:24755} bladder well.  Patient {DOES NOT does:27190::does not} use a catheter to empty bladder.  When urinating, patient feels {urine symptoms:24756} Drinks: *** per day  UTIs: {NUMBERS 1-10:18281} UTI's in the last year.   {ACTIONS;DENIES/REPORTS:21021675::Denies} history of {urologic concerns:24757} No results found for the last 90 days.   Pelvic Organ Prolapse Symptoms:                  Patient {denies/ admits to:24761} a feeling of a bulge the vaginal area. It has been present for {NUMBER 1-10:22536} {days/wks/mos/yrs:310907}.  Patient {denies/ admits to:24761} seeing a bulge.  This bulge {ACTION; IS/IS WNU:78978602} bothersome.  Bowel Symptom: Bowel movements: *** time(s) per {Time; day/week/month:13537} Stool consistency: {stool consistency:24758} Straining: {yes/no:19897}.  Splinting: {yes/no:19897}.  Incomplete evacuation: {yes/no:19897}.  Patient {denies/ admits to:24761} accidental bowel leakage / fecal incontinence  Occurs: *** time(s) per {Time; day/week/month:13537}  Consistency with leakage: {stool  consistency:24758} Bowel regimen: {bowel regimen:24759} Last colonoscopy: Date ***, Results *** [facility] Colonoscopy   This patient has no relevant Health Maintenance data.     Sexual Function Sexually active: {yes/no:19897}.  Sexual orientation: {Sexual Orientation:[phone]} Pain with sex: {pain with sex:24762}  Pelvic Pain {denies/ admits to:24761} pelvic pain Location: *** Pain occurs: *** Prior pain treatment: *** Improved by: *** Worsened by: ***   Past Medical History:  Past Medical History:  Diagnosis Date   Anemia    Asthma    GERD (gastroesophageal reflux disease)    with pregnancy   Pericarditis    after cold symptoms   Postpartum care following cesarean delivery (1/31) 09/21/2013   PUPPP (pruritic urticarial papules and plaques of pregnancy)    currently   Uterine prolapse      Past Surgical History:   Past Surgical History:  Procedure Laterality Date   CESAREAN SECTION N/A 09/21/2013   Procedure: Primary CESAREAN SECTION  Twins;  Surgeon: Percilla Burly, MD;  Location: [facility];  Service: Obstetrics;  Laterality: N/A;  EDD: 10/12/13   facial plastic surgery     birth mark removed from forehead   WISDOM TOOTH EXTRACTION       Past OB/GYN History: OB History  Gravida Para Term Preterm AB Living  2 2 2   3   SAB IAB Ectopic Multiple Live Births     1 3    # Outcome Date GA Lbr Len/2nd Weight Sex Type Anes PTL Lv  2A Term 09/21/13 [redacted]w[redacted]d   F CS-LTranv Spinal  LIV  2B Term 09/21/13 [redacted]w[redacted]d   F CS-LTranv Spinal  LIV  1 Term 2011     Vag-Spont   LIV    Vaginal deliveries: ***,  Forceps/ Vacuum deliveries: ***, Cesarean section: 2 Menopausal: No, LMP No LMP recorded. (Menstrual status: IUD).  Contraception: IUD. Last pap smear.  Any history of abnormal pap smears: {yes/no:19897}.    Component Value Date/Time   DIAGPAP  03/26/2024 0929    - Negative for Intraepithelial Lesions or Malignancy (NILM)   DIAGPAP - Benign reactive/reparative changes 03/26/2024  0929   HPVHIGH Negative 03/26/2024 0929   ADEQPAP  03/26/2024 0929    Satisfactory for evaluation; transformation zone component PRESENT.    Medications: Patient has a current medication list which includes the following prescription(s): acetaminophen , albuterol , albuterol , amphetamine-dextroamphetamine, azithromycin , benzonatate , buspirone, calcium carbonate, fluconazole , fluoxetine , ibuprofen , levonorgestrel , meclizine , intrarosa , prednisone , and promethazine -dextromethorphan.   Allergies: Patient is allergic to cephalexin, elemental sulfur, sulfa antibiotics, tape, and sulfamethoxazole.   Social History:  Social History   Tobacco Use   Smoking status: Never   Smokeless tobacco: Never  Vaping Use   Vaping status: Never Used  Substance Use Topics   Alcohol use: Yes    Comment: social   Drug use: No    Relationship status: {relationship status:24764} Patient lives with ***.   Patient {ACTION; IS/IS WNU:78978602} employed ***. Regular exercise: {Yes/No:304960894} History of abuse: {Yes/No:304960894}  Family History:   Family History  Problem Relation Age of Onset   Breast cancer Mother 70   Breast cancer Cousin        20s   Breast cancer Cousin        40s     Review of Systems: ROS   OBJECTIVE Physical Exam: There were no vitals filed for this visit.  Physical Exam   GU / Detailed Urogynecologic Evaluation:  Pelvic Exam: Normal external female genitalia; Bartholin's and Skene's glands normal in appearance; urethral meatus normal in appearance, no urethral masses or discharge.   CST: {gen negative/positive:315881}  Reflexes: bulbocavernosis {DESC; PRESENT/NOT PRESENT:21021351}, anocutaneous {DESC; PRESENT/NOT PRESENT:21021351} ***bilaterally.  Speculum exam reveals normal vaginal mucosa {With/Without:20273} atrophy. Cervix {exam; gyn cervix:30847}. Uterus {exam; pelvic uterus:30849}. Adnexa {exam; adnexa:12223}.    s/p hysterectomy: Speculum exam reveals  normal vaginal mucosa {With/Without:20273}  atrophy and normal vaginal cuff.  Adnexa {exam; adnexa:12223}.    With apex supported, anterior compartment defect was {reduced:24765}  Pelvic floor strength {Roman # I-V:19040}/V, puborectalis {Roman # I-V:19040}/V external anal sphincter {Roman # I-V:19040}/V  Pelvic floor musculature: Right levator {Tender/Non-tender:20250}, Right obturator {Tender/Non-tender:20250}, Left levator {Tender/Non-tender:20250}, Left obturator {Tender/Non-tender:20250}  POP-Q:   POP-Q                                               Aa                                               Ba                                                 C                                                Gh  Pb                                               tvl                                                Ap                                               Bp                                                 D      Rectal Exam:  Normal sphincter tone, {rectocele:24766} distal rectocele, enterocoele {DESC; PRESENT/NOT PRESENT:21021351}, no rectal masses, {sign of:24767} dyssynergia when asking the patient to bear down.  Post-Void Residual (PVR) by Bladder Scan: In order to evaluate bladder emptying, we discussed obtaining a postvoid residual and patient agreed to this procedure.  Procedure: The ultrasound unit was placed on the patient's abdomen in the suprapubic region after the patient had voided.      Laboratory Results: Lab Results  Component Value Date   BILIRUBINUR NEGATIVE 08/18/2013   PROTEINUR NEGATIVE 08/18/2013   UROBILINOGEN 0.2 08/18/2013   LEUKOCYTESUR NEGATIVE 08/18/2013    Lab Results  Component Value Date   CREATININE 0.80 03/26/2024    Lab Results  Component Value Date   HGBA1C 5.3 03/26/2024    Lab Results  Component Value Date   HGB 13.2 03/26/2024     ASSESSMENT AND PLAN Ms. Caris is a 38  y.o. with: No diagnosis found.  There are no diagnoses linked to this encounter.   Lianne ONEIDA Gillis, MD

## 2024-07-15 ENCOUNTER — Ambulatory Visit: Payer: Self-pay | Admitting: Obstetrics

## 2024-07-15 ENCOUNTER — Encounter: Payer: Self-pay | Admitting: Obstetrics

## 2024-07-15 VITALS — BP 123/79 | HR 75 | Ht 62.0 in | Wt 167.0 lb

## 2024-07-15 DIAGNOSIS — R351 Nocturia: Secondary | ICD-10-CM | POA: Diagnosis not present

## 2024-07-15 DIAGNOSIS — N8110 Cystocele, unspecified: Secondary | ICD-10-CM | POA: Insufficient documentation

## 2024-07-15 DIAGNOSIS — N393 Stress incontinence (female) (male): Secondary | ICD-10-CM | POA: Diagnosis not present

## 2024-07-15 DIAGNOSIS — N952 Postmenopausal atrophic vaginitis: Secondary | ICD-10-CM | POA: Diagnosis not present

## 2024-07-15 LAB — POCT URINALYSIS DIP (CLINITEK)
Bilirubin, UA: NEGATIVE
Glucose, UA: NEGATIVE mg/dL
Ketones, POC UA: NEGATIVE mg/dL
Leukocytes, UA: NEGATIVE
Nitrite, UA: NEGATIVE
POC PROTEIN,UA: NEGATIVE
Spec Grav, UA: 1.02 (ref 1.010–1.025)
Urobilinogen, UA: 0.2 U/dL
pH, UA: 7.5 (ref 5.0–8.0)

## 2024-07-15 MED ORDER — ESTRADIOL 0.01 % VA CREA: TOPICAL_CREAM | VAGINAL | 3 refills | Status: AC

## 2024-07-15 NOTE — Patient Instructions (Addendum)
 You have a stage 2 (out of 4) prolapse.  We discussed the fact that it is not life threatening but there are several treatment options. For treatment of pelvic organ prolapse, we discussed options for management including expectant management, conservative management, and surgical management, such as Kegels, a pessary, pelvic floor physical therapy, and specific surgical procedures.     We discussed two options for prolapse repair:  1) vaginal repair without mesh - Pros - safer, no mesh complications - Cons - not as strong as mesh repair, higher risk of recurrence  2) laparoscopic repair with mesh - Pros - stronger, better long-term success - Cons - risks of mesh implant (erosion into vagina or bladder, adhering to the rectum, pain) - these risks are lower than with a vaginal mesh but still exist  For treatment of stress urinary incontinence, which is leakage with physical activity/movement/strainging/coughing, we discussed expectant management versus nonsurgical options versus surgery. Nonsurgical options include weight loss, physical therapy, as well as a pessary.  Surgical options include a midurethral sling, which is a synthetic mesh sling that acts like a hammock under the urethra to prevent leakage of urine, a Burch urethropexy, and transurethral injection of a bulking agent.   For night time frequency: - avoid fluid intake 3 hours before bedtime  For vaginal atrophy (thinning of the vaginal tissue that can cause dryness and burning) and UTI prevention we discussed estrogen replacement in the form of vaginal cream.   Start vaginal estrogen therapy nightly for two weeks then 2 times weekly at night. This can be placed with your finger or an applicator inside the vagina and around the urethra.  Please let us  know if the prescription is too expensive and we can look for alternative options.   Is vaginal estrogen therapy safe for me? Vaginal estrogen preparations act on the vaginal skin, and  only a very tiny amount is absorbed into the bloodstream (0.01%).  They work in a similar way to hand or face cream.  There is minimal absorption and they are therefore perfectly safe. If you have had breast cancer and have persistent troublesome symptoms which aren't settling with vaginal moisturisers and lubricants, local estrogen treatment may be a possibility, but consultation with your oncologist should take place first.   Please call if it is cost prohibitive at your pharmacy and we can send in Premarin or [facility].

## 2024-07-15 NOTE — Assessment & Plan Note (Addendum)
-   POCT UA + heme, PVR 16mL - For treatment of stress urinary incontinence,  non-surgical options include expectant management, weight loss, physical therapy, as well as a pessary.  Surgical options include a midurethral sling, Burch urethropexy, and transurethral injection of a bulking agent. - we discussed urethral bulking (Bulkamid). We discussed success rate of approximately 70-80% and possible need for second injection. We reviewed that this is not a permanent procedure and the Bulkamid does become less effective over time. Risks reviewed including injury to bladder or urethra, UTI, urinary retention and hematuria.  - Sling: The effectiveness of a midurethral vaginal mesh sling is approximately 85%, and thus, there will be times when you may leak urine after surgery, especially if your bladder is full or if you have a strong cough. There is a balance between making the sling tight enough to treat your leakage but not too tight so that you have long-term difficulty emptying your bladder. A mesh sling will not directly treat overactive bladder/urge incontinence and may worsen it.  There is an FDA safety notification on vaginal mesh procedures for prolapse but NOT mesh slings. We have extensive experience and training with mesh placement and we have close postoperative follow up to identify any potential complications from mesh. It is important to realize that this mesh is a permanent implant that cannot be easily removed. There are rare risks of mesh exposure (2-4%), pain with intercourse (0-7%), and infection (<1%). The risk of mesh exposure if more likely in a woman with risks for poor healing (prior radiation, poorly controlled diabetes, or immunocompromised). The risk of new or worsened chronic pain after mesh implant is more common in women with baseline chronic pain and/or poorly controlled anxiety or depression. Approximately 2-4% of patients will experience longer-term post-operative voiding  dysfunction that may require surgical revision of the sling. We also reviewed that postoperatively, her stream may not be as strong as before surgery.  - pt desires to avoid mesh use. Desires to proceed with urethral bulking - return with a full bladder for repeat CST

## 2024-07-15 NOTE — Assessment & Plan Note (Signed)
-   vaginal dryness improved with intrarosa , costly $80 - For symptomatic vaginal atrophy options include lubrication with a water-based lubricant, personal hygiene measures and barrier protection against wetness, and estrogen replacement in the form of vaginal cream, vaginal tablets, or a time-released vaginal ring.   - Rx to start low dose vaginal estrogen, can change to [facility] or [facility]

## 2024-07-15 NOTE — Assessment & Plan Note (Signed)
-   avoid fluid intake 3 hours before bedtime - encouraged fluid management and caffeine reduction

## 2024-07-15 NOTE — Assessment & Plan Note (Signed)
-   desires to proceed with hysterectomy by Dr. Glennon - For treatment of pelvic organ prolapse, we discussed options for management including expectant management, conservative management, and surgical management, such as Kegels, a pessary, pelvic floor physical therapy, and specific surgical procedures. - We discussed two options for prolapse repair:  1) vaginal repair without mesh - Pros - safer, no mesh complications - Cons - not as strong as mesh repair, higher risk of recurrence  2) laparoscopic repair with mesh - Pros - stronger, better long-term success - Cons - risks of mesh implant (erosion into vagina or bladder, adhering to the rectum, pain) - these risks are lower than with a vaginal mesh but still exist - desires to avoid mesh use, will proceed with concomitant exam under anesthesia, uterosacral ligament suspension, anterior/posterior repair, perineorrhaphy

## 2024-08-13 ENCOUNTER — Encounter: Payer: Self-pay | Admitting: Obstetrics and Gynecology

## 2024-08-13 ENCOUNTER — Other Ambulatory Visit: Payer: Self-pay | Admitting: Obstetrics and Gynecology

## 2024-08-13 MED ORDER — OSELTAMIVIR PHOSPHATE 75 MG PO CAPS: 75.0000 mg | ORAL_CAPSULE | Freq: Two times a day (BID) | ORAL | 0 refills | Status: AC

## 2024-08-19 ENCOUNTER — Encounter: Payer: Self-pay | Admitting: Obstetrics

## 2024-08-29 ENCOUNTER — Encounter: Payer: Self-pay | Admitting: Obstetrics

## 2024-08-30 ENCOUNTER — Encounter: Payer: Self-pay | Admitting: Obstetrics and Gynecology

## 2024-08-30 ENCOUNTER — Ambulatory Visit: Admitting: Obstetrics and Gynecology

## 2024-08-30 VITALS — BP 118/78 | HR 73 | Ht 63.0 in | Wt 169.0 lb

## 2024-08-30 DIAGNOSIS — N8110 Cystocele, unspecified: Secondary | ICD-10-CM

## 2024-08-30 DIAGNOSIS — Z01818 Encounter for other preprocedural examination: Secondary | ICD-10-CM | POA: Diagnosis not present

## 2024-08-30 MED ORDER — METOCLOPRAMIDE HCL 10 MG PO TABS: 10.0000 mg | ORAL_TABLET | Freq: Three times a day (TID) | ORAL | 0 refills | Status: AC | PRN

## 2024-08-30 MED ORDER — IBUPROFEN 600 MG PO TABS: 600.0000 mg | ORAL_TABLET | Freq: Four times a day (QID) | ORAL | 1 refills | Status: AC

## 2024-08-30 MED ORDER — OXYCODONE HCL 5 MG PO TABS: 5.0000 mg | ORAL_TABLET | ORAL | 0 refills | Status: AC | PRN

## 2024-08-30 NOTE — Progress Notes (Unsigned)
 Mirena  inserted: 03-02-20 Pap:  03/26/24 Mammo:  03/15/23

## 2024-08-30 NOTE — Patient Instructions (Signed)
 Robotic Laparoscopic Hysterectomy, Care After  The following information offers guidance on how to care for yourself after your procedure. Your health care provider may also give you more specific instructions. If you have problems or questions, contact your health care provider. What can I expect after the procedure? After the procedure, it is common to have: Pain, bruising, and numbness around your incisions. Tiredness (fatigue).  Abdominal bloating Poor appetite. Chest discomfort that radiates to your shoulder from the carbon dioxide gas for a few days after  Vaginal discharge or spotting. You will need to use a sanitary pad after this procedure.  HEAVY BLEEDING LIKE A PERIOD IS NOT NORMAL.  PLEASE CALL YOUR PROVIDER IF SOAKING A PAD or have copious discharge and or pain.  Feelings of sadness or other emotions.  If your ovaries were also removed, it is also common to have symptoms of menopause, such as hot flashes, night sweats, and lack of sleep (insomnia).  Ovaries should stay in if at all possible until at least the age of 62. Follow these instructions at home: Medicines Take over-the-counter and prescription medicines only as told by your health care provider. Ask your health care provider if the medicine prescribed to you: Requires you to avoid driving or using machinery. You cannot drive for 24 hours after anesthesia Can cause constipation. You may need to take these actions to prevent or treat constipation: Drink enough fluid to keep your urine pale yellow. Take over-the-counter or prescription medicines. Eat foods that are high in fiber, such as beans, whole grains, and fresh fruits and vegetables. Limit foods that are high in fat and processed sugars, such as fried or sweet foods.  Also, avoid spicy foods.  NAUSEA IS COMMON THE FIRST NIGHT OF SURGERY.  IF IT LASTS BEYOND 24 HOURS, CALL YOUR PROVIDER.  NAUSEA MEDICATION WAS GIVEN AT YOUR PREOP APPOINTMENT THAT YOU CAN TAKE  AFTER SURGERY. Incision care  Follow instructions from your health care provider about how to take care of your incisions. Make sure you: LEAVE INCISION OPEN AND DRY-NO BANDAGES Leave stitches (sutures), skin glue, or adhesive strips in place UNTIL 2 WEEKS THEN REMOVE IN THE SHOWER.  If adhesive strip edges start to loosen and curl up, you may trim the loose edges. Check your incision areas every day for signs of infection. Check for: More redness, swelling, or pain. Fluid or blood. Warmth. Pus or a bad smell. Activity  Rest as told by your health care provider. Avoid sitting for a long time without moving. Get up to take short walks every 1-2 hours. This is important to improve blood flow and breathing. Ask for help if you feel weak or unsteady.  If you are sore or tired, rest. Return to your normal activities as told by your health care provider. Ask your health care provider what activities are safe for you. Do not lift, push or pull anything that is heavier than 13 lb (4.5 kg), or the limit that you are told, for 6 WEEKS after surgery or until your health care provider says that it is safe. If you were given a sedative during the procedure, it can affect you for several hours. Do not drive or operate machinery until your health care provider says that it is safe. Lifestyle Do not use any products that contain nicotine or tobacco. These products include cigarettes, chewing tobacco, and vaping devices, such as e-cigarettes. These can delay healing after surgery. If you need help quitting, ask your health care provider.  Do not drink alcohol until your health care provider approves. Take a daily multivitamin and keep a high protein diet for wound healing  DO NOT HAVE INTERCOURSE UNTIL YOU ARE INSTRUCTED THAT IT IS SAFE TO DO SO  Post operative appointments need to be scheduled at 2, 6 and 10 weeks.  You can come anytime before these with any concerns.   Discussed and reviewed with patient  risks with early intercourse or use of any foreign objects (externally or internally) can increase your risk including but not limited to the risk of vaginal cuff separation and or infection, risks for bowel involvement, risk for emergent surgery, and hospital admission with need for antibiotics.  Discussed in cases with cuff separation and bowel involvement there may be the need for colostomy placement as well.  In no situation should she have intercourse unless cleared to do so.  This can be anywhere from 10 weeks or longer after surgery.  General instructions YOU MAY TAKE SHOWERS ONLY FOR 2 WEEKS AFTER SURGERY, THEN YOU MAY USE TUBS AND HOT TUBS OR SWIM AFTER THAT Do not douche, use tampons, or have sex for at least 10 weeks, or possibly longer. You will need to have an exam done in the office to be cleared to have intercourse. If you struggle with physical or emotional changes after your procedure, speak with your health care provider or a therapist.  IF YOU HAVE BURNING WITH URINATION, PLEASE CALL YOUR DOCTOR. BLADDER INFECTIONS MAY OCCUR AFTER SURGERY Try to have someone at home with you for the first week to help with your daily chores.  Most patients are driving by the end of the first week after the robotic hysterectomy. Wear compression stockings as told by your health care provider. These stockings help to prevent blood clots and reduce swelling in your legs. Keep all follow-up visits. This is important. Contact a health care provider if: You have any of these signs of infection: Chills or a fever 148f OR GREATER. More redness, swelling, or pain around an incision. Fluid or blood coming from an incision. Warmth coming from an incision. Pus or a bad smell coming from an incision. Burning with urination. Urinary frequency or cramping.   IF YOU HAVE THESE SYMPTOMS, PLEASE CALL THE OFFICE TO COME EVALUATE FOR A BLADDER INFECTION AT [phone] An incision opens. You feel dizzy or  light-headed. You have pain or bleeding when you urinate, or you are unable to urinate. You have abnormal vaginal discharge. You have pain that does not get better with medicine. Get help right away if: You have a fever and your symptoms suddenly get worse. You have severe abdominal pain. Heavy vaginal bleeding, like a period You have chest pain or shortness of breath. You may have chest pain and shortness of breath from the CO2 gas for a few days after surgery.  This is very common.  Walking, Gas-X and motrin  will usually help relieve this discomfort You faint. You have pain, swelling, or redness in your leg.  These symptoms may represent a serious problem that is an emergency. Do not wait to see if the symptoms will go away. Get medical help right away. Call your local emergency services (911 in the [place] Do not drive yourself to the hospital. Summary  CONSTIPATION MEDICATION AFTER SURGERY: COLACE, MOM, MIRALAX , GAS X are all helpful to have on hand, if needed.  FILL ALL POSTOP MEDICATION BEFORE SURGERY   [email or web address] is a nice blog site for women preparing for the  robotic hysterectomy

## 2024-09-02 ENCOUNTER — Encounter: Payer: Self-pay | Admitting: *Deleted

## 2024-09-02 NOTE — Progress Notes (Signed)
 "   39 y.o. y.o. female here for preop for RLH, removal of mirena  IUD, bilateral salpingectomy, cystoscopy Combined case with Dr. Guadlupe. No LMP recorded. (Menstrual status: IUD).    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 can feel the cervix when she is voiding. Has to hold her voids as well, being a runner, broadcasting/film/video.  Has wanted to take care of the prolapse but has been dealing with family needs. Mother had same prolapse then had hysterectomy started patch and then dx with breast cancer. Cautious of HRT use. Does report decreased libido, vaginal dryness, dyspareunia to try intrarosa . Stress with 3 kids, work, husband suffered tbi after anesthesia reaction with surgery.  He cannot work and sometimes feels like another child to her and she is tearful about the guilt of that. She loves him however, and will support him.  She is just overwhelmed with her responsibilities. Does not want more children Had horrible periods before her mirena  IUD and does not want to bleed.   HPI:  vaginal dryness.  No vaginal pain.  No SUI.  Sometimes feels that a little bit of urine is left in the bladder after miction.  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  No pain with IC.  No pelvic pain.  Well with Mirena  IUD x 02/2020.  Family h/o weak pelvic floor in mother. Body mass index is 29.94 kg/m.     03/26/2024    9:38 AM  Depression screen PHQ 2/9  Decreased Interest 0  Down, Depressed, Hopeless 0  PHQ - 2 Score 0    Blood pressure 118/78, pulse 73, height 5' 3 (1.6 m), weight 169 lb (76.7 kg), SpO2 95%.     Component Value Date/Time   DIAGPAP  03/26/2024 0929    - Negative for Intraepithelial Lesions or Malignancy (NILM)   DIAGPAP - Benign reactive/reparative changes 03/26/2024 0929   HPVHIGH Negative 03/26/2024 0929   ADEQPAP  03/26/2024 0929    Satisfactory for evaluation; transformation zone component PRESENT.    GYN HISTORY:    Component  Value Date/Time   DIAGPAP  03/26/2024 0929    - Negative for Intraepithelial Lesions or Malignancy (NILM)   DIAGPAP - Benign reactive/reparative changes 03/26/2024 0929   HPVHIGH Negative 03/26/2024 0929   ADEQPAP  03/26/2024 0929    Satisfactory for evaluation; transformation zone component PRESENT.    OB History  Gravida Para Term Preterm AB Living  2 2 2   3   SAB IAB Ectopic Multiple Live Births     1 3    # Outcome Date GA Lbr Len/2nd Weight Sex Type Anes PTL Lv  2A Term 09/21/13 [id number]   F CS-LTranv Spinal  LIV  2B Term 09/21/13 [redacted]w[redacted]d   F CS-LTranv Spinal  LIV  1 Term 2011     Vag-Spont   LIV    Past Medical History:  Diagnosis Date   Anemia    Asthma    GERD (gastroesophageal reflux disease)    with pregnancy   Pericarditis    after cold symptoms   Postpartum care following cesarean delivery (1/31) 09/21/2013   PUPPP (pruritic urticarial papules and plaques of pregnancy)    currently   Uterine prolapse     Past Surgical History:  Procedure Laterality Date   CESAREAN SECTION N/A 09/21/2013   Procedure: Primary CESAREAN SECTION  Twins;  Surgeon: Percilla Burly, MD;  Location: [facility];  Service: Obstetrics;  Laterality:  N/A;  EDD: 10/12/13   facial plastic surgery     birth mark removed from forehead   WISDOM TOOTH EXTRACTION      Current Outpatient Medications on File Prior to Visit  Medication Sig Dispense Refill   acetaminophen  (TYLENOL ) 500 MG tablet Take 1,000 mg by mouth every 6 (six) hours as needed for mild pain or moderate pain.      albuterol  (PROVENTIL  HFA;VENTOLIN  HFA) 108 (90 Base) MCG/ACT inhaler Inhale 2 puffs into the lungs every 6 (six) hours as needed for shortness of breath (asthma). 1 Inhaler 3   albuterol  (PROVENTIL ) (2.5 MG/3ML) 0.083% nebulizer solution Take 3 mLs (2.5 mg total) by nebulization every 6 (six) hours as needed for wheezing or shortness of breath. 150 mL 1   amphetamine-dextroamphetamine (ADDERALL XR) 20 MG 24 hr capsule Take 1  capsule by mouth every morning.     calcium carbonate (TUMS - DOSED IN MG ELEMENTAL CALCIUM) 500 MG chewable tablet Chew 2 tablets by mouth daily as needed for indigestion or heartburn.      estradiol  (ESTRACE ) 0.01 % CREA vaginal cream Place 0.5g at night twice a week after 30 g 3   FLUoxetine  (PROZAC ) 20 MG capsule Take 1 capsule (20 mg total) by mouth at bedtime. 60 capsule 11   levonorgestrel  (MIRENA ) 20 MCG/24HR IUD by Intrauterine route.     Prasterone  (INTRAROSA ) 6.5 MG INST Place 1 suppository vaginally at bedtime as needed. 30 each 12   busPIRone (BUSPAR) 5 MG tablet Take 1 tablet by mouth daily. (Patient not taking: Reported on 08/30/2024)     No current facility-administered medications on file prior to visit.    Social History   Socioeconomic History   Marital status: Married    Spouse name: Not on file   Number of children: Not on file   Years of education: Not on file   Highest education level: Not on file  Occupational History   Not on file  Tobacco Use   Smoking status: Never   Smokeless tobacco: Never  Vaping Use   Vaping status: Never Used  Substance and Sexual Activity   Alcohol use: Yes    Comment: social   Drug use: No   Sexual activity: Yes    Partners: Male    Birth control/protection: I.U.D.    Comment: First sexual intercourse at 39 yrs  old. less than 5  Other Topics Concern   Not on file  Social History Narrative   Not on file   Social Drivers of Health   Tobacco Use: Low Risk (08/30/2024)   Patient History    Smoking Tobacco Use: Never    Smokeless Tobacco Use: Never    Passive Exposure: Not on file  Financial Resource Strain: Low Risk (09/14/2022)   Received from [facility]   Overall Financial Resource Strain (CARDIA)    Difficulty of Paying Living Expenses: Not very hard  Food Insecurity: No Food Insecurity (09/14/2022)   Received from [facility]   Epic    Within the past 12 months, you worried that your food would run out before you  got the money to buy more.: Never true    Within the past 12 months, the food you bought just didn't last and you didn't have money to get more.: Never true  Transportation Needs: No Transportation Needs (09/14/2022)   Received from [facility] - Transportation    Lack of Transportation (Medical): No    Lack of Transportation (Non-Medical): No  Physical Activity: Not on file  Stress: Not on file  Social Connections: Not on file  Intimate Partner Violence: Not on file  Depression (PHQ2-9): Low Risk (03/26/2024)   Depression (PHQ2-9)    PHQ-2 Score: 0  Alcohol Screen: Not on file  Housing: Not on file  Utilities: Not At Risk (09/14/2022)   Received from [facility] Utilities    Threatened with loss of utilities: No  Health Literacy: Not on file    Family History  Problem Relation Age of Onset   Breast cancer Mother 22   Breast cancer Cousin        76s   Breast cancer Cousin        56s   Colon cancer Other    Bladder Cancer Neg Hx    Uterine cancer Neg Hx    Renal cancer Neg Hx      Allergies  Allergen Reactions   Cephalexin Anaphylaxis    Unknown reaction- heart issue   Elemental Sulfur Itching   Sulfa Antibiotics Itching and Rash   Tape Rash    TAPE USED FOR EPIDURAL TAPE USED FOR EPIDURAL   Wound Dressing Adhesive Dermatitis    TAPE USED FOR EPIDURAL   Sulfamethoxazole Itching      Patient's last menstrual period was No LMP recorded. (Menstrual status: IUD)..            Review of Systems Alls systems reviewed and are negative.     Physical Exam Constitutional:      Appearance: Normal appearance.  Genitourinary:     Vulva and urethral meatus normal.     No lesions in the vagina.     Genitourinary Comments: Cervix at introitus     Right Labia: No rash, lesions or skin changes.    Left Labia: No lesions, skin changes or rash.    No vaginal discharge or tenderness.     Apical vaginal prolapse present.    No vaginal atrophy  present.     Right Adnexa: not tender, not palpable and no mass present.    Left Adnexa: not tender, not palpable and no mass present.    No cervical motion tenderness or discharge.     IUD strings visualized.     Uterus is not enlarged, tender or irregular.  Breasts:    Right: Normal.     Left: Normal.  HENT:     Head: Normocephalic.  Neck:     Thyroid: No thyroid mass, thyromegaly or thyroid tenderness.  Cardiovascular:     Rate and Rhythm: Normal rate and regular rhythm.     Heart sounds: Normal heart sounds, S1 normal and S2 normal.  Pulmonary:     Effort: Pulmonary effort is normal.     Breath sounds: Normal breath sounds and air entry.  Abdominal:     General: There is no distension.     Palpations: Abdomen is soft. There is no mass.     Tenderness: There is no abdominal tenderness. There is no guarding or rebound.  Musculoskeletal:        General: Normal range of motion.     Cervical back: Full passive range of motion without pain, normal range of motion and neck supple. No tenderness.     Right lower leg: No edema.     Left lower leg: No edema.  Neurological:     Mental Status: She is alert.  Skin:    General: Skin is warm.  Psychiatric:  Mood and Affect: Mood normal.        Behavior: Behavior normal.        Thought Content: Thought content normal.  Vitals and nursing note reviewed. Exam conducted with a chaperone present.       A:         Preop exam             Situational stress Vaginal prolapse Menorrhagia before mirena  IUD Desires definitive management   dysmenorrhea             P:        The risks of surgery were discussed in detail with the patient including but not limited to: bleeding which may require transfusion or reoperation; infection which may require prolonged hospitalization or re-hospitalization and antibiotic therapy; injury to bowel, bladder, ureters and major vessels or other surrounding organs which may lead to other procedures;  formation of adhesions; need for additional procedures including laparotomy or subsequent procedures secondary to intraoperative injury or abnormal pathology; thromboembolic phenomenon; incisional problems and other postoperative or anesthesia complications.  The postoperative expectations were also discussed in detail. The patient also understands the alternative treatment options which were discussed in full. All questions were answered.  Patient would like to proceed with the procedure.  15 minutes spent on reviewing records, imaging,  and one on one patient time and counseling patient and documentation Dr. Glennon   No follow-ups on file.  Almarie MARLA Glennon "

## 2024-09-13 ENCOUNTER — Encounter: Payer: Self-pay | Admitting: Obstetrics

## 2024-09-13 ENCOUNTER — Ambulatory Visit: Admitting: Obstetrics

## 2024-09-13 VITALS — BP 132/81 | HR 84 | Ht 62.6 in | Wt 172.4 lb

## 2024-09-13 DIAGNOSIS — N8110 Cystocele, unspecified: Secondary | ICD-10-CM | POA: Diagnosis not present

## 2024-09-13 DIAGNOSIS — N393 Stress incontinence (female) (male): Secondary | ICD-10-CM | POA: Diagnosis not present

## 2024-09-13 DIAGNOSIS — Z01818 Encounter for other preprocedural examination: Secondary | ICD-10-CM | POA: Diagnosis not present

## 2024-09-13 NOTE — Assessment & Plan Note (Signed)
-   POCT UA + heme, PVR 16mL - For treatment of stress urinary incontinence,  non-surgical options include expectant management, weight loss, physical therapy, as well as a pessary.  Surgical options include a midurethral sling, Burch urethropexy, and transurethral injection of a bulking agent. - we discussed urethral bulking (Bulkamid). We discussed success rate of approximately 70-80% and possible need for second injection. We reviewed that this is not a permanent procedure and the Bulkamid does become less effective over time. Risks reviewed including injury to bladder or urethra, UTI, urinary retention and hematuria.  - Sling: The effectiveness of a midurethral vaginal mesh sling is approximately 85%, and thus, there will be times when you may leak urine after surgery, especially if your bladder is full or if you have a strong cough. There is a balance between making the sling tight enough to treat your leakage but not too tight so that you have long-term difficulty emptying your bladder. A mesh sling will not directly treat overactive bladder/urge incontinence and may worsen it.  There is an FDA safety notification on vaginal mesh procedures for prolapse but NOT mesh slings. We have extensive experience and training with mesh placement and we have close postoperative follow up to identify any potential complications from mesh. It is important to realize that this mesh is a permanent implant that cannot be easily removed. There are rare risks of mesh exposure (2-4%), pain with intercourse (0-7%), and infection (<1%). The risk of mesh exposure if more likely in a woman with risks for poor healing (prior radiation, poorly controlled diabetes, or immunocompromised). The risk of new or worsened chronic pain after mesh implant is more common in women with baseline chronic pain and/or poorly controlled anxiety or depression. Approximately 2-4% of patients will experience longer-term post-operative voiding  dysfunction that may require surgical revision of the sling. We also reviewed that postoperatively, her stream may not be as strong as before surgery.  - pt desires to avoid mesh use. Desires to proceed with urethral bulking - positive CST with simple CMG

## 2024-09-13 NOTE — Assessment & Plan Note (Signed)
-   pt desires concomitant prolapse repair at the time of hysterectomy by Dr. Glennon  - For treatment of pelvic organ prolapse, we discussed options for management including expectant management, conservative management, and surgical management, such as Kegels, a pessary, pelvic floor physical therapy, and specific surgical procedures. - We discussed two options for prolapse repair:  1) vaginal repair without mesh - Pros - safer, no mesh complications - Cons - not as strong as mesh repair, higher risk of recurrence  2) laparoscopic repair with mesh - Pros - stronger, better long-term success - Cons - risks of mesh implant (erosion into vagina or bladder, adhering to the rectum, pain) - these risks are lower than with a vaginal mesh but still exist - desires to avoid mesh use and proceed with concomitant exam under anesthesia, uterosacral ligament suspension, anterior/posterior repair, perineorrhaphy

## 2024-09-13 NOTE — Progress Notes (Signed)
 [facility] Pre-Operative H&P  Subjective Chief Complaint: Savannah Arnold presents for a preoperative encounter.   History of Present Illness: Savannah Arnold is a 39 y.o. female who presents for preoperative visit.  She is scheduled to undergo robotic assisted laparoscopic uterosacral ligament suspension, possible anterior/posterior repair, perineorrhaphy, urethral bulking, and cystourethroscopy on 09/30/24.  Her symptoms include stress urinary incontinence, nocturia, and she was was found to have Stage II pelvic organ prolapse.   Pending RATLH with salpingectomy and removal of IUD by Dr. Glennon SUI 2-3x/month since pregnancy Voids 11-12/day, 2-3x/night When urinating, patient feels a weak stream, dribbling after finishing, the need to urinate multiple times in a row, and to push on her belly or vagina to empty bladder Drinks: 40oz water per day, 10oz coffee, 8oz apple cider, 0-1 12oz soda/day   Cherry tomato vaginal bulge noted when she wiped postvoid. Denies bleeding, discharge or pain.  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  Diarrhea triggered by high fat content, fried foods. Constipation triggered by cheese. Takes miralax every few months.  Mostly Type III and IV stool 3-7x/week, reports straining every 1-2 weeks.  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  Tried intrarosa  for decreased libido, with reduced vaginal dryness and dyspareunia.  Husband suffered from TBI after anesthesia reaction from surgery Denies relief with Kegel exercises Previously referred to pelvic floor PT, however restricted due to work  Past Medical History:  Diagnosis Date   Anemia    Asthma    GERD (gastroesophageal reflux disease)    with pregnancy   Pericarditis    after cold symptoms   Postpartum care following cesarean delivery (1/31) 09/21/2013   PUPPP (pruritic urticarial papules and plaques of pregnancy)     currently   Uterine prolapse      Past Surgical History:  Procedure Laterality Date   CESAREAN SECTION N/A 09/21/2013   Procedure: Primary CESAREAN SECTION  Twins;  Surgeon: Percilla Burly, MD;  Location: [facility];  Service: Obstetrics;  Laterality: N/A;  EDD: 10/12/13   facial plastic surgery     birth mark removed from forehead   WISDOM TOOTH EXTRACTION      is allergic to cephalexin, elemental sulfur, sulfa antibiotics, tape, wound dressing adhesive, and sulfamethoxazole.   Family History  Problem Relation Age of Onset   Breast cancer Mother 29   Breast cancer Cousin        41s   Breast cancer Cousin        7s   Colon cancer Other    Bladder Cancer Neg Hx    Uterine cancer Neg Hx    Renal cancer Neg Hx     Social History[1]   Review of Systems was negative for a full 10 system review except as noted in the History of Present Illness.  Current Medications[2]   Objective Vitals:   09/13/24 1112  BP: 132/81  Pulse: 84    Gen: NAD CV: S1 S2 RRR Lungs: Clear to auscultation bilaterally Abd: soft, nontender  Negative CST with reduction of prolapse  Verbal consent was obtained to perform simple CMG procedure:   Prolapse was reduced using 2 large cotton swabs. Urethra was prepped with betadine and a 104F catheter was placed and bladder was drained completely. The bladder was then backfilled with sterile water by gravity.  First sensation: First Desire: Strong Desire: Capacity: Cough stress test was positive. Valsalva stress  test was positive.  She was was allowed to void on her own.   Interpretation: CMG showed decreased sensation, and normal cystometric capacity. Findings positive for stress incontinence, negative for detrusor overactivity.   Previous Pelvic Exam showed: POP-Q 07/15/24:    POP-Q   0                                            Aa   0                                           Ba   -4                                               C    3                                            Gh   4                                            Pb   10                                            tvl    -1                                            Ap   -1                                            Bp   -5                                              D      Assessment/ Plan  Assessment: The patient is a 39 y.o. year old scheduled to undergo robotic assisted laparoscopic uterosacral ligament suspension, possible anterior/posterior repair, perineorrhaphy, urethral bulking, and cystourethroscopy. Verbal consent was obtained for these procedures.  SUI (stress urinary incontinence, female) Assessment & Plan: - POCT UA + heme, PVR 16mL - For treatment of stress urinary incontinence,  non-surgical options include expectant management, weight loss, physical therapy, as well as a pessary.  Surgical options include a midurethral sling, Burch urethropexy, and transurethral injection of a bulking agent. - we discussed urethral bulking (Bulkamid). We discussed success rate of approximately 70-80% and possible need for second injection. We reviewed that this is not a permanent procedure and the Bulkamid  does become less effective over time. Risks reviewed including injury to bladder or urethra, UTI, urinary retention and hematuria.  - Sling: The effectiveness of a midurethral vaginal mesh sling is approximately 85%, and thus, there will be times when you may leak urine after surgery, especially if your bladder is full or if you have a strong cough. There is a balance between making the sling tight enough to treat your leakage but not too tight so that you have long-term difficulty emptying your bladder. A mesh sling will not directly treat overactive bladder/urge incontinence and may worsen it.  There is an FDA safety notification on vaginal mesh procedures for prolapse but NOT mesh slings. We have extensive experience and training  with mesh placement and we have close postoperative follow up to identify any potential complications from mesh. It is important to realize that this mesh is a permanent implant that cannot be easily removed. There are rare risks of mesh exposure (2-4%), pain with intercourse (0-7%), and infection (<1%). The risk of mesh exposure if more likely in a woman with risks for poor healing (prior radiation, poorly controlled diabetes, or immunocompromised). The risk of new or worsened chronic pain after mesh implant is more common in women with baseline chronic pain and/or poorly controlled anxiety or depression. Approximately 2-4% of patients will experience longer-term post-operative voiding dysfunction that may require surgical revision of the sling. We also reviewed that postoperatively, her stream may not be as strong as before surgery.  - pt desires to avoid mesh use. Desires to proceed with urethral bulking - positive CST with simple CMG    Pelvic organ prolapse quantification stage 2 cystocele Assessment & Plan: - pt desires concomitant prolapse repair at the time of hysterectomy by Dr. Glennon  - For treatment of pelvic organ prolapse, we discussed options for management including expectant management, conservative management, and surgical management, such as Kegels, a pessary, pelvic floor physical therapy, and specific surgical procedures. - We discussed two options for prolapse repair:  1) vaginal repair without mesh - Pros - safer, no mesh complications - Cons - not as strong as mesh repair, higher risk of recurrence  2) laparoscopic repair with mesh - Pros - stronger, better long-term success - Cons - risks of mesh implant (erosion into vagina or bladder, adhering to the rectum, pain) - these risks are lower than with a vaginal mesh but still exist - desires to avoid mesh use and proceed with concomitant exam under anesthesia, uterosacral ligament suspension, anterior/posterior repair,  perineorrhaphy   Plan: General Surgical Consent: The patient has previously been counseled on alternative treatments, and the decision by the patient and provider was to proceed with the procedure listed above.  For all procedures, there are risks of bleeding, infection, damage to surrounding organs including but not limited to bowel, bladder, blood vessels, ureters and nerves, and need for further surgery if an injury were to occur. These risks are all low with minimally invasive surgery.   There are risks of numbness and weakness at any body site or buttock/rectal pain.  It is possible that baseline pain can be worsened by surgery, either with or without mesh. If surgery is vaginal, there is also a low risk of possible conversion to laparoscopy or open abdominal incision where indicated. Very rare risks include blood transfusion, blood clot, heart attack, pneumonia, or death.   There is also a risk of short-term postoperative urinary retention with need to use a catheter. About half of patients need  to go home from surgery with a catheter, which is then later removed in the office. The risk of long-term need for a catheter is very low. There is also a risk of worsening of overactive bladder.    Prolapse (with or without mesh): Risk factors for surgical failure  include things that put pressure on your pelvis and the surgical repair, including obesity, chronic cough, and heavy lifting or straining (including lifting children or adults, straining on the toilet, or lifting heavy objects such as furniture or anything weighing >25 lbs. Risks of recurrence is 20-30% with vaginal native tissue repair and a less than 10% with sacrocolpopexy with mesh.    We discussed consent for blood products. Risks for blood transfusion include allergic reactions, other reactions that can affect different body organs and managed accordingly, transmission of infectious diseases such as HIV or Hepatitis. However, the blood  is screened. Patient consents for blood products.  Pre-operative instructions:  She was instructed to not take Aspirin/NSAIDs x 7days prior to surgery. She may continue her 81mg  ASA. Antibiotic prophylaxis ordered as indicated.  Catheter use: Patient will go home with foley if needed after post-operative voiding trial.  Post-operative instructions:  She was provided with specific post-operative instructions, including precautions and signs/symptoms for which we would recommend contacting us , in addition to daytime and after-hours contact phone numbers. This was provided on a handout.   Post-operative medications: Prescriptions for motrin , tylenol , miralax, and oxycodone  will be sent to her pharmacy by Dr. Glennon. Discussed using ibuprofen  and tylenol  on a schedule to limit use of narcotics.   Laboratory testing:  We will check labs: CBC. Day of surgery UPT  Preoperative clearance:  She does not require surgical clearance.    Post-operative follow-up:  A post-operative appointment will be made for 6 weeks from the date of surgery. If she needs a post-operative nurse visit for a voiding trial, that will be set up after she leaves the hospital.    Patient will call the clinic or use MyChart should anything change or any new issues arise.   Lianne ONEIDA Gillis, MD     [1]  Social History Tobacco Use   Smoking status: Never   Smokeless tobacco: Never  Vaping Use   Vaping status: Never Used  Substance Use Topics   Alcohol use: Yes    Comment: social   Drug use: No  [2]  Current Outpatient Medications:    acetaminophen  (TYLENOL ) 500 MG tablet, Take 1,000 mg by mouth every 6 (six) hours as needed for mild pain or moderate pain. , Disp: , Rfl:    albuterol  (PROVENTIL  HFA;VENTOLIN  HFA) 108 (90 Base) MCG/ACT inhaler, Inhale 2 puffs into the lungs every 6 (six) hours as needed for shortness of breath (asthma)., Disp: 1 Inhaler, Rfl: 3   albuterol  (PROVENTIL ) (2.5 MG/3ML) 0.083% nebulizer solution,  Take 3 mLs (2.5 mg total) by nebulization every 6 (six) hours as needed for wheezing or shortness of breath., Disp: 150 mL, Rfl: 1   amphetamine-dextroamphetamine (ADDERALL XR) 20 MG 24 hr capsule, Take 1 capsule by mouth every morning., Disp: , Rfl:    busPIRone (BUSPAR) 5 MG tablet, Take 1 tablet by mouth daily., Disp: , Rfl:    calcium carbonate (TUMS - DOSED IN MG ELEMENTAL CALCIUM) 500 MG chewable tablet, Chew 2 tablets by mouth daily as needed for indigestion or heartburn. , Disp: , Rfl:    estradiol  (ESTRACE ) 0.01 % CREA vaginal cream, Place 0.5g at night twice a week after,  Disp: 30 g, Rfl: 3   FLUoxetine  (PROZAC ) 20 MG capsule, Take 1 capsule (20 mg total) by mouth at bedtime., Disp: 60 capsule, Rfl: 11   ibuprofen  (ADVIL ) 600 MG tablet, Take 1 tablet (600 mg total) by mouth every 6 (six) hours., Disp: 30 tablet, Rfl: 1   levonorgestrel  (MIRENA ) 20 MCG/24HR IUD, by Intrauterine route., Disp: , Rfl:    metoCLOPramide  (REGLAN ) 10 MG tablet, Take 1 tablet (10 mg total) by mouth every 8 (eight) hours as needed., Disp: 5 tablet, Rfl: 0   oxyCODONE  (ROXICODONE ) 5 MG immediate release tablet, Take 1 tablet (5 mg total) by mouth every 4 (four) hours as needed., Disp: 10 tablet, Rfl: 0   Prasterone  (INTRAROSA ) 6.5 MG INST, Place 1 suppository vaginally at bedtime as needed., Disp: 30 each, Rfl: 12

## 2024-09-13 NOTE — Patient Instructions (Addendum)
 We discussed prolapse repair:  vaginal repair without mesh - Pros - safer, no mesh complications - Cons - not as strong as mesh repair, higher risk of recurrence  We reviewed urethral bulking (Bulkamid). We discussed success rate of approximately 70-80% and possible need for second injection. We reviewed that this is not a permanent procedure and the Bulkamid does become less effective over time. Risks reviewed including injury to bladder or urethra, UTI, urinary retention and hematuria.   Please call if you experience any change in urinary or vaginal symptoms prior to surgery

## 2024-09-16 ENCOUNTER — Encounter: Admitting: Obstetrics and Gynecology

## 2024-09-16 ENCOUNTER — Encounter: Admitting: Obstetrics

## 2024-09-18 ENCOUNTER — Telehealth: Payer: Self-pay

## 2024-09-18 NOTE — Telephone Encounter (Signed)
 FMLA paperwork is complete, please drop charges.

## 2024-09-23 ENCOUNTER — Encounter: Payer: Self-pay | Admitting: Obstetrics and Gynecology

## 2024-09-23 NOTE — Telephone Encounter (Signed)
 FMLA forms completed and printed to [employer].

## 2024-09-23 NOTE — Telephone Encounter (Signed)
 Routing to [employer] for [facility]

## 2024-09-25 DIAGNOSIS — Z0289 Encounter for other administrative examinations: Secondary | ICD-10-CM

## 2024-09-25 NOTE — Pre-Procedure Instructions (Signed)
 Surgical Instructions   Your procedure is scheduled on September 30, 2024. Report to [facility] Main Entrance A at 11:15 A.M., then check in with the Admitting office. Any questions or running late day of surgery: call [phone]  Questions prior to your surgery date: call [phone], Monday-Friday, 8am-4pm. If you experience any cold or flu symptoms such as cough, fever, chills, shortness of breath, etc. between now and your scheduled surgery, please notify us  at the above number.     Remember:  Do not eat after midnight the night before your surgery   You may drink clear liquids until 10:15 AM the morning of your surgery.   Clear liquids allowed are: Water, Non-Citrus Juices (without pulp), Carbonated Beverages, Clear Tea (no milk, honey, etc.), Black Coffee Only (NO MILK, CREAM OR POWDERED CREAMER of any kind), and Gatorade.    Take these medicines the morning of surgery with A SIP OF WATER: busPIRone (BUSPAR)  FLUoxetine  (PROZAC )  oxyCODONE  (ROXICODONE )    May take these medicines IF NEEDED: acetaminophen  (TYLENOL )  albuterol  (PROVENTIL  HFA;VENTOLIN  HFA) inhaler - please bring inhaler with you morning of surgery albuterol  (PROVENTIL ) nebulizer solution  metoCLOPramide  (REGLAN )    One week prior to surgery, STOP taking any Aspirin (unless otherwise instructed by your surgeon) Aleve, Naproxen, Ibuprofen , Motrin , Advil , Goody's, BC's, all herbal medications, fish oil, and non-prescription vitamins.                     Do NOT Smoke (Tobacco/Vaping) for 24 hours prior to your procedure.  If you use a CPAP at night, you may bring your mask/headgear for your overnight stay.   You will be asked to remove any contacts, glasses, piercing's, hearing aid's, dentures/partials prior to surgery. Please bring cases for these items if needed.    Your surgeon will determine if you are to be admitted or discharged the same day.  Patients discharged the day of surgery will not be allowed  to drive home, and someone needs to stay with them for 24 hours.  SURGICAL WAITING ROOM VISITATION Patients may have no more than 2 support people in the waiting area - these visitors may rotate.   Pre-op nurse will coordinate an appropriate time for 2 ADULT support persons, who may not rotate, to accompany patient in pre-op.  Children under the age of 16 must have an adult with them who is not the patient and must remain in the main waiting area with an adult.  If the patient needs to stay at the hospital during part of their recovery, the visitor guidelines for inpatient rooms apply.  Please refer to the [facility] website for the visitor guidelines for any additional information.   If you received a COVID test during your pre-op visit  it is requested that you wear a mask when out in public, stay away from anyone that may not be feeling well and notify your surgeon if you develop symptoms. If you have been in contact with anyone that has tested positive in the last 10 days please notify you surgeon.      Pre-operative CHG Bathing Instructions   You can play a key role in reducing the risk of infection after surgery. Your skin needs to be as free of germs as possible. You can reduce the number of germs on your skin by washing with CHG (chlorhexidine gluconate) soap before surgery. CHG is an antiseptic soap that kills germs and continues to kill germs even after washing.  DO NOT use if you have an allergy to chlorhexidine/CHG or antibacterial soaps. If your skin becomes reddened or irritated, stop using the CHG and notify one of our RNs at [phone].              TAKE A SHOWER THE NIGHT BEFORE SURGERY   Please keep in mind the following:  DO NOT shave, including legs and underarms, 48 hours prior to surgery.   You may shave your face before/day of surgery.  Place clean sheets on your bed the night before surgery Use a clean washcloth (not used since being washed) for shower. DO  NOT sleep with pet's night before surgery.  CHG Shower Instructions:  Wash your face and private area with normal soap. If you choose to wash your hair, wash first with your normal shampoo.  After you use shampoo/soap, rinse your hair and body thoroughly to remove shampoo/soap residue.  Turn the water OFF and apply half the bottle of CHG soap to a CLEAN washcloth.  Apply CHG soap ONLY FROM YOUR NECK DOWN TO YOUR TOES (washing for 3-5 minutes)  DO NOT use CHG soap on face, private areas, open wounds, or sores.  Pay special attention to the area where your surgery is being performed.  If you are having back surgery, having someone wash your back for you may be helpful. Wait 2 minutes after CHG soap is applied, then you may rinse off the CHG soap.  Pat dry with a clean towel  Put on clean pajamas    Additional instructions for the day of surgery: If you choose, you may shower the morning of surgery with an antibacterial soap.  DO NOT APPLY any lotions, deodorants, cologne, or perfumes.   Do not wear jewelry or makeup Do not wear nail polish, gel polish, artificial nails, or any other type of covering on natural nails (fingers and toes) Do not bring valuables to the hospital. [facility] is not responsible for valuables/personal belongings. Put on clean/comfortable clothes.  Please brush your teeth.  Ask your nurse before applying any prescription medications to the skin.

## 2024-09-26 ENCOUNTER — Other Ambulatory Visit: Payer: Self-pay

## 2024-09-26 ENCOUNTER — Encounter (HOSPITAL_COMMUNITY): Payer: Self-pay

## 2024-09-26 ENCOUNTER — Inpatient Hospital Stay (HOSPITAL_COMMUNITY): Admission: RE | Admit: 2024-09-26 | Discharge: 2024-09-26 | Attending: Obstetrics

## 2024-09-26 VITALS — BP 121/70 | HR 67 | Temp 98.4°F | Resp 18 | Ht 63.0 in | Wt 173.0 lb

## 2024-09-26 DIAGNOSIS — Z01818 Encounter for other preprocedural examination: Secondary | ICD-10-CM

## 2024-09-26 LAB — CBC
HCT: 37.7 % (ref 36.0–46.0)
Hemoglobin: 13.5 g/dL (ref 12.0–15.0)
MCH: 31.8 pg (ref 26.0–34.0)
MCHC: 35.8 g/dL (ref 30.0–36.0)
MCV: 88.7 fL (ref 80.0–100.0)
Platelets: 256 10*3/uL (ref 150–400)
RBC: 4.25 MIL/uL (ref 3.87–5.11)
RDW: 11.5 % (ref 11.5–15.5)
WBC: 6.9 10*3/uL (ref 4.0–10.5)
nRBC: 0 % (ref 0.0–0.2)

## 2024-09-26 LAB — TYPE AND SCREEN
ABO/RH(D): O POS
Antibody Screen: NEGATIVE

## 2024-09-26 NOTE — Progress Notes (Signed)
 PCP - Jon Kiang [facility] Cardiologist -   PPM/ICD - denies Device Orders - na Rep Notified - na  Chest x-ray - 07/31/2023 EKG -  Stress Test -  ECHO - 2011 Cardiac Cath -   Sleep Study - denies CPAP - na  Non-diabetic  Blood Thinner Instructions:denies Aspirin Instructions:denies  ERAS Protcol - Clears until 1015  Anesthesia review: No  Patient denies shortness of breath, fever, cough and chest pain at PAT appointment   All instructions explained to the patient, with a verbal understanding of the material. Patient agrees to go over the instructions while at home for a better understanding. Patient also instructed to self quarantine after being tested for COVID-19. The opportunity to ask questions was provided.

## 2024-09-27 MED ORDER — CLINDAMYCIN PHOSPHATE 900 MG/50ML IV SOLN
900.0000 mg | INTRAVENOUS | Status: AC
  Filled 2024-09-27: qty 50

## 2024-09-27 MED ORDER — GENTAMICIN SULFATE 40 MG/ML IJ SOLN
5.0000 mg/kg | INTRAVENOUS | Status: AC
  Filled 2024-09-27: qty 9.75

## 2024-09-30 ENCOUNTER — Ambulatory Visit (HOSPITAL_COMMUNITY): Admission: RE | Admit: 2024-09-30 | Source: Home / Self Care | Admitting: Obstetrics

## 2024-09-30 ENCOUNTER — Encounter (HOSPITAL_COMMUNITY): Admission: RE | Payer: Self-pay | Source: Home / Self Care

## 2024-09-30 DIAGNOSIS — Z01818 Encounter for other preprocedural examination: Secondary | ICD-10-CM

## 2024-10-14 ENCOUNTER — Encounter: Admitting: Obstetrics and Gynecology

## 2024-11-11 ENCOUNTER — Encounter: Admitting: Obstetrics

## 2024-11-14 ENCOUNTER — Encounter: Admitting: Obstetrics and Gynecology

## 2024-12-05 ENCOUNTER — Encounter: Admitting: Obstetrics and Gynecology
